# Patient Record
Sex: Male | Born: 1965 | Race: Black or African American | Hispanic: No | Marital: Married | Smoking: Current every day smoker
Health system: Southern US, Community
[De-identification: ages and names within clinical notes are randomized; demographics above are authoritative.]

## PROBLEM LIST (undated history)

## (undated) DIAGNOSIS — I10 Essential (primary) hypertension: Secondary | ICD-10-CM

## (undated) HISTORY — PX: HERNIA REPAIR: SHX51

---

## 2014-08-10 ENCOUNTER — Encounter (HOSPITAL_BASED_OUTPATIENT_CLINIC_OR_DEPARTMENT_OTHER): Payer: Self-pay | Admitting: Emergency Medicine

## 2014-08-10 ENCOUNTER — Emergency Department (HOSPITAL_BASED_OUTPATIENT_CLINIC_OR_DEPARTMENT_OTHER)
Admission: EM | Admit: 2014-08-10 | Discharge: 2014-08-10 | Disposition: A | Discharge status: Home / Self Care | Payer: BLUE CROSS/BLUE SHIELD | Attending: Emergency Medicine | Admitting: Emergency Medicine

## 2014-08-10 ENCOUNTER — Emergency Department (HOSPITAL_BASED_OUTPATIENT_CLINIC_OR_DEPARTMENT_OTHER): Payer: BLUE CROSS/BLUE SHIELD

## 2014-08-10 DIAGNOSIS — R091 Pleurisy: Secondary | ICD-10-CM | POA: Diagnosis not present

## 2014-08-10 DIAGNOSIS — R05 Cough: Secondary | ICD-10-CM | POA: Insufficient documentation

## 2014-08-10 DIAGNOSIS — R739 Hyperglycemia, unspecified: Secondary | ICD-10-CM | POA: Diagnosis not present

## 2014-08-10 DIAGNOSIS — E669 Obesity, unspecified: Secondary | ICD-10-CM | POA: Diagnosis not present

## 2014-08-10 DIAGNOSIS — R0602 Shortness of breath: Secondary | ICD-10-CM | POA: Diagnosis not present

## 2014-08-10 DIAGNOSIS — R079 Chest pain, unspecified: Secondary | ICD-10-CM

## 2014-08-10 DIAGNOSIS — Z720 Tobacco use: Secondary | ICD-10-CM | POA: Diagnosis not present

## 2014-08-10 LAB — CBC WITH DIFFERENTIAL/PLATELET
BASOS ABS: 0 10*3/uL (ref 0.0–0.1)
Basophils Relative: 0 % (ref 0–1)
Eosinophils Absolute: 0.2 10*3/uL (ref 0.0–0.7)
Eosinophils Relative: 1 % (ref 0–5)
HCT: 49.9 % (ref 39.0–52.0)
Hemoglobin: 17 g/dL (ref 13.0–17.0)
LYMPHS PCT: 37 % (ref 12–46)
Lymphs Abs: 4.5 10*3/uL — ABNORMAL HIGH (ref 0.7–4.0)
MCH: 27.8 pg (ref 26.0–34.0)
MCHC: 34.1 g/dL (ref 30.0–36.0)
MCV: 81.5 fL (ref 78.0–100.0)
Monocytes Absolute: 1 10*3/uL (ref 0.1–1.0)
Monocytes Relative: 8 % (ref 3–12)
NEUTROS PCT: 54 % (ref 43–77)
Neutro Abs: 6.5 10*3/uL (ref 1.7–7.7)
Platelets: 203 10*3/uL (ref 150–400)
RBC: 6.12 MIL/uL — AB (ref 4.22–5.81)
RDW: 13.8 % (ref 11.5–15.5)
WBC: 12.2 10*3/uL — ABNORMAL HIGH (ref 4.0–10.5)

## 2014-08-10 LAB — BASIC METABOLIC PANEL
ANION GAP: 6 (ref 5–15)
BUN: 12 mg/dL (ref 6–23)
CHLORIDE: 101 mmol/L (ref 96–112)
CO2: 22 mmol/L (ref 19–32)
Calcium: 8.9 mg/dL (ref 8.4–10.5)
Creatinine, Ser: 0.95 mg/dL (ref 0.50–1.35)
GFR calc Af Amer: >90 mL/min (ref >90)
GFR calc non Af Amer: >90 mL/min (ref >90)
Glucose, Bld: 405 mg/dL — ABNORMAL HIGH (ref 70–99)
Potassium: 4 mmol/L (ref 3.5–5.1)
Sodium: 129 mmol/L — ABNORMAL LOW (ref 135–145)

## 2014-08-10 LAB — TROPONIN I: Troponin I: <0.03 ng/mL (ref <0.031)

## 2014-08-10 LAB — D-DIMER, QUANTITATIVE (NOT AT ARMC): D-Dimer, Quant: <0.27 ug/mL-FEU (ref 0.00–0.48)

## 2014-08-10 MED ORDER — HYDROCODONE-ACETAMINOPHEN 5-325 MG PO TABS: 1.0000 | ORAL_TABLET | Freq: Four times a day (QID) | ORAL | Status: DC | PRN

## 2014-08-10 MED ORDER — METFORMIN HCL 500 MG PO TABS: 500.0000 mg | ORAL_TABLET | Freq: Two times a day (BID) | ORAL | Status: AC

## 2014-08-10 MED ORDER — AZITHROMYCIN 250 MG PO TABS: 250.0000 mg | ORAL_TABLET | Freq: Every day | ORAL | Status: AC

## 2014-08-10 NOTE — ED Notes (Signed)
Pt having chest pain radiating to left ribs since Thursday.  Pt seen at [facility] and was sent home with cough syrup.  Pt did have some cold symptoms but chest discomfort has continued.  Some sob, some diaphoresis.

## 2014-08-10 NOTE — ED Notes (Signed)
Pt d/c home- directed to pharmacy to pick up medications- follow up care discussed

## 2014-08-10 NOTE — ED Provider Notes (Addendum)
CSN: [id number]     Arrival date & time 08/10/14  1058 History   First MD Initiated Contact with Patient 08/10/14 1114     Chief Complaint  Patient presents with  . Chest Pain     (Consider location/radiation/quality/duration/timing/severity/associated sxs/prior Treatment) HPI Comments: Patient was initially taken to [facility] when the pain developed at work on Thursday. He said they did an EKG and a chest x-ray there and told him everything was fine but his blood sugar was elevated. Patient has since made an appointment with a provider for follow-up on his blood sugar  Patient is a 49 y.o. male presenting with chest pain. The history is provided by the patient.  Chest Pain Pain location:  L chest Pain quality: aching and sharp   Pain radiates to:  Does not radiate Pain radiates to the back: no   Pain severity:  Moderate Onset quality:  Sudden Duration:  5 days Timing:  Constant Progression:  Worsening Chronicity:  New Context comment:  Started 5 days ago after he got out of bed.   Relieved by:  Nothing Worsened by:  Coughing, deep breathing and movement Ineffective treatments: ibuprofen. Associated symptoms: cough and shortness of breath   Associated symptoms: no abdominal pain, no altered mental status, no anorexia, no fever, no nausea, not vomiting and no weakness   Cough:    Cough characteristics:  Productive   Sputum characteristics:  Yellow   Severity:  Moderate   Duration:  3 weeks   Timing:  Constant   Progression:  Unchanged   Chronicity:  New Risk factors: male sex, obesity and smoking   Risk factors: no hypertension   Risk factors comment:  Recently had a trip to [place] and back (8 hours both ways)   No past medical history on file. Past Surgical History  Procedure Laterality Date  . Hernia repair     No family history on file. History  Substance Use Topics  . Smoking status: Current Every Day Smoker  . Smokeless tobacco: Not on file  . Alcohol  Use: Not on file    Review of Systems  Constitutional: Negative for fever.  Respiratory: Positive for cough and shortness of breath.   Cardiovascular: Positive for chest pain.  Gastrointestinal: Negative for nausea, vomiting, abdominal pain and anorexia.  Neurological: Negative for weakness.  All other systems reviewed and are negative.     Allergies  Review of patient's allergies indicates no known allergies.  Home Medications   Prior to Admission medications   Not on File   BP 145/96 mmHg  Pulse 101  Temp(Src) 98.2 F (36.8 C) (Oral)  Resp 22  Ht  (1.854 m)  Wt 315 lb (142.883 kg)  BMI 41.57 kg/m2  SpO2 98% Physical Exam  Constitutional: He is oriented to person, place, and time. He appears well-developed and well-nourished. No distress.  HENT:  Head: Normocephalic and atraumatic.  Mouth/Throat: Oropharynx is clear and moist.  Eyes: Conjunctivae and EOM are normal. Pupils are equal, round, and reactive to light.  Neck: Normal range of motion. Neck supple.  Cardiovascular: Normal rate, regular rhythm and intact distal pulses.   No murmur heard. Pulmonary/Chest: Effort normal and breath sounds normal. No respiratory distress. He has no wheezes. He has no rales. He exhibits tenderness.    Abdominal: Soft. He exhibits no distension. There is no tenderness. There is no rebound and no guarding.  Musculoskeletal: Normal range of motion. He exhibits no edema or tenderness.  Neurological: He  is alert and oriented to person, place, and time.  Skin: Skin is warm and dry. No rash noted. No erythema.  Psychiatric: He has a normal mood and affect. His behavior is normal.  Nursing note and vitals reviewed.   ED Course  Procedures (including critical care time) Labs Review Labs Reviewed  CBC WITH DIFFERENTIAL/PLATELET - Abnormal; Notable for the following:    WBC 12.2 (*)    RBC 6.12 (*)    Lymphs Abs 4.5 (*)    All other components within normal limits  BASIC  METABOLIC PANEL - Abnormal; Notable for the following:    Sodium 129 (*)    Glucose, Bld 405 (*)    All other components within normal limits  TROPONIN I  D-DIMER, QUANTITATIVE    Imaging Review Dg Chest 2 View  08/10/2014   CLINICAL DATA:  Right side chest pain and cough since 08/05/2014.  EXAM: CHEST  2 VIEW  COMPARISON:  PA and lateral chest 08/06/2014 and 08/25/2008.  FINDINGS: Heart size and mediastinal contours are within normal limits. Both lungs are clear. Visualized skeletal structures are unremarkable.  IMPRESSION: Negative exam.   Electronically Signed   By: Drusilla Kannerhomas  Dalessio M.D.   On: 08/10/2014 11:29     EKG Interpretation   Date/Time:  Monday August 10 2014 11:04:42 EST Ventricular Rate:  102 PR Interval:  138 QRS Duration: 78 QT Interval:  350 QTC Calculation: 456 R Axis:   74 Text Interpretation:  Sinus tachycardia Nonspecific ST abnormality No  previous tracing Confirmed by Anitra LauthPLUNKETT  MD, Alphonzo LemmingsWHITNEY ([id number]) on 08/10/2014  11:14:24 AM      MDM   Final diagnoses:  Chest pain  Pleurisy  Hyperglycemia    Patient presenting now with 5 days of pleuritic type chest pain in the left side of chest that is not improving. Initially when pain started he was seen at another emergency room and at that time had x-rays and blood work done as well as an EKG he was told everything was normal and he was given cough medicine. However the pain has persisted and he continues to have some shortness of breath.  Of note patient also recently traveled to [place] and back within 4 days prior to the pain starting to 16 hours in the car. He denies unilateral leg pain or swelling and no prior history of clot. He was tachycardic upon arrival here at 102 and pain is reproducible with palpation. He denies any symptoms exacerbated by eating and low suspicion for abdominal pathology at this time. EKG showed sinus tachycardia without other significant findings. Low suspicion for ACS at this time  however concern for PE versus pleurisy as patient has had a lingering URI for the last 3 weeks and is a smoker.  Chest x-ray is within normal limits. CBC, BMP, d-dimer, troponin pending  12:23 PM Pt's CBC with leukocytosis of 12,000 and BMP with hyperglycemia of 400.  Trop and dimer neg.  Concern for possible developing infection not seen on CXR given sx of 3 week and smoker/diabetic and will treat with azithro.  Also will start pt on metformin.     Gwyneth SproutWhitney Daryle Amis, MD 08/10/14 1225  Gwyneth SproutWhitney Dwyne Hasegawa, MD 08/10/14 1228

## 2015-06-25 ENCOUNTER — Emergency Department (HOSPITAL_BASED_OUTPATIENT_CLINIC_OR_DEPARTMENT_OTHER)
Admission: EM | Admit: 2015-06-25 | Discharge: 2015-06-25 | Disposition: A | Discharge status: Home / Self Care | Payer: BLUE CROSS/BLUE SHIELD | Attending: Emergency Medicine | Admitting: Emergency Medicine

## 2015-06-25 ENCOUNTER — Encounter (HOSPITAL_BASED_OUTPATIENT_CLINIC_OR_DEPARTMENT_OTHER): Payer: Self-pay

## 2015-06-25 DIAGNOSIS — T23291A Burn of second degree of multiple sites of right wrist and hand, initial encounter: Secondary | ICD-10-CM | POA: Diagnosis not present

## 2015-06-25 DIAGNOSIS — F17210 Nicotine dependence, cigarettes, uncomplicated: Secondary | ICD-10-CM | POA: Diagnosis not present

## 2015-06-25 DIAGNOSIS — T23001A Burn of unspecified degree of right hand, unspecified site, initial encounter: Secondary | ICD-10-CM | POA: Diagnosis present

## 2015-06-25 DIAGNOSIS — Y92190 Kitchen in other specified residential institution as the place of occurrence of the external cause: Secondary | ICD-10-CM | POA: Diagnosis not present

## 2015-06-25 DIAGNOSIS — Z23 Encounter for immunization: Secondary | ICD-10-CM | POA: Diagnosis not present

## 2015-06-25 DIAGNOSIS — Y93G3 Activity, cooking and baking: Secondary | ICD-10-CM | POA: Diagnosis not present

## 2015-06-25 DIAGNOSIS — T23231A Burn of second degree of multiple right fingers (nail), not including thumb, initial encounter: Secondary | ICD-10-CM | POA: Insufficient documentation

## 2015-06-25 DIAGNOSIS — Y998 Other external cause status: Secondary | ICD-10-CM | POA: Diagnosis not present

## 2015-06-25 DIAGNOSIS — X150XXA Contact with hot stove (kitchen), initial encounter: Secondary | ICD-10-CM | POA: Diagnosis not present

## 2015-06-25 DIAGNOSIS — I10 Essential (primary) hypertension: Secondary | ICD-10-CM | POA: Insufficient documentation

## 2015-06-25 DIAGNOSIS — T23251A Burn of second degree of right palm, initial encounter: Secondary | ICD-10-CM | POA: Diagnosis not present

## 2015-06-25 DIAGNOSIS — T23201A Burn of second degree of right hand, unspecified site, initial encounter: Secondary | ICD-10-CM

## 2015-06-25 DIAGNOSIS — Z7984 Long term (current) use of oral hypoglycemic drugs: Secondary | ICD-10-CM | POA: Diagnosis not present

## 2015-06-25 HISTORY — DX: Essential (primary) hypertension: I10

## 2015-06-25 MED ORDER — SILVER SULFADIAZINE 1 % EX CREA
TOPICAL_CREAM | Freq: Once | CUTANEOUS | Status: AC
  Administered 2015-06-25: 07:00:00 via TOPICAL
  Filled 2015-06-25: qty 85

## 2015-06-25 MED ORDER — HYDROCODONE-ACETAMINOPHEN 5-325 MG PO TABS: 1.0000 | ORAL_TABLET | Freq: Four times a day (QID) | ORAL | Status: DC | PRN

## 2015-06-25 MED ORDER — SILVER SULFADIAZINE 1 % EX CREA: 1.0000 "application " | TOPICAL_CREAM | Freq: Every day | CUTANEOUS | Status: AC

## 2015-06-25 MED ORDER — TETANUS-DIPHTH-ACELL PERTUSSIS 5-2.5-18.5 LF-MCG/0.5 IM SUSP
0.5000 mL | Freq: Once | INTRAMUSCULAR | Status: AC
  Administered 2015-06-25: 0.5 mL via INTRAMUSCULAR
  Filled 2015-06-25: qty 0.5
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
## 2015-06-25 NOTE — Discharge Instructions (Signed)
Burn Care °Your skin is a natural barrier to infection. It is the largest organ of your body. Burns damage this natural protection. To help prevent infection, it is very important to follow your caregiver's instructions in the care of your burn. °Burns are classified as: °· First degree. There is only redness of the skin (erythema). No scarring is expected. °· Second degree. There is blistering of the skin. Scarring may occur with deeper burns. °· Third degree. All layers of the skin are injured, and scarring is expected. °HOME CARE INSTRUCTIONS  °· Wash your hands well before changing your bandage. °· Change your bandage as often as directed by your caregiver. °· Remove the old bandage. If the bandage sticks, you may soak it off with cool, clean water. °· Cleanse the burn thoroughly but gently with mild soap and water. °· Pat the area dry with a clean, dry cloth. °· Apply a thin layer of antibacterial cream to the burn. °· Apply a clean bandage as instructed by your caregiver. °· Keep the bandage as clean and dry as possible. °· Elevate the affected area for the first 24 hours, then as instructed by your caregiver. °· Only take over-the-counter or prescription medicines for pain, discomfort, or fever as directed by your caregiver. °SEEK IMMEDIATE MEDICAL CARE IF:  °· You develop excessive pain. °· You develop redness, tenderness, swelling, or red streaks near the burn. °· The burned area develops yellowish-white fluid (pus) or a bad smell. °· You have a fever. °MAKE SURE YOU:  °· Understand these instructions. °· Will watch your condition. °· Will get help right away if you are not doing well or get worse. °  °This information is not intended to replace advice given to you by your health care provider. Make sure you discuss any questions you have with your health care provider. °  °Document Released: 07/03/2005 Document Revised: 09/25/2011 Document Reviewed: 11/23/2010 °Elsevier Interactive Patient Education ©2016  [employer] ° °Second-Degree Burn °A second-degree burn affects the 2 outer layers of skin. The outer layer (epidermis) and the layer underneath it (dermis) are both burned. Another name for this type of burn is a partial thickness burn. A second-degree burn may be called minor or major. This depends on the size of the burn. It also depends on what parts of the skin are burned. Minor burns may be treated with first aid. Major burns are a medical emergency. °A second-degree burn is worse than a first-degree burn, but not as bad as a third-degree burn. A first-degree burn affects only the epidermis. A third-degree burn goes through all the layers of skin. A second-degree burn usually heals in 3 to 4 weeks. A minor second-degree burn usually does not leave a scar. Deeper second-degree burns may lead to scarring of the skin or contractures over joints. Contractures are scars that form over joints and may lead to reduced mobility at those joints. °CAUSES °· Heat (thermal) injury. This happens when skin comes in contact with something very hot. It could be a flame, a hot object, hot liquid, or steam. Most second-degree burns are thermal injuries. °· Radiation. Sunlight is one type of radiation that can burn the skin. Another type of radiation is used to heat food. Radiation is also used to treat some diseases, such as cancer. All types of radiation can burn the skin. Sunlight usually causes a first-degree burn. Radiation used for heating food or treating a disease can cause a second-degree burn. °· Electricity. Electrical burns can cause more   damage under the skin than on the surface. They should always be treated as major burns. °· Chemicals. Many chemicals can burn the skin. The burn should be flushed with cool water and checked by an emergency caregiver. °SYMPTOMS °Symptoms of second-degree burns include: °· Severe pain. °· Extreme tenderness. °· Deep redness. °· Blistered skin. °· Skin that has changed color. It  might look blotchy, wet, or shiny. °· Swelling. °TREATMENT °Some second-degree burns may need to be treated in a hospital. These include major burns, electrical burns, and chemical burns. Many other second-degree burns can be treated with regular first aid, such as: °· Cooling the burn. Use cool, germ-free (sterile) salt water. Place the burned area of skin into a tub of water, or cover the burned area with clean, wet towels. °· Taking pain medicine. °· Removing the dead skin from broken blisters. A trained caregiver may do this. Do not pop blisters. °· Gently washing your skin with mild soap. °· Covering the burned area with a cream. Silver sulfadiazine is a cream for burns. An antibiotic cream, such as bacitracin, may also be used to fight infection. Do not use other ointments or creams unless your caregiver says it is okay. °· Protecting the burn with a sterile, non-sticky bandage. °· Bandaging fingers and toes separately. This keeps them from sticking together. °· Taking an antibiotic. This can help prevent infection. °· Getting a tetanus shot. °HOME CARE INSTRUCTIONS °Medication °· Take any medicine prescribed by your caregiver. Follow the directions carefully. °· Ask your caregiver if you can take over-the-counter medicine to relieve pain and swelling. Do not give aspirin to children. °· Make sure your caregiver knows about all other medicines you take. This includes over-the-counter medicines. °Burn care °· You will need to change the bandage on your burn. You may need to do this 2 or 3 times each day. °¨ Gently clean the burned area. °¨ Put ointment on it. °¨ Cover the burn with a sterile bandage. °· For some deeper burns or burns that cover a large area, compression garments may be prescribed. These garments can help minimize scarring and protect your mobility. °· Do not put butter or oil on your skin. Use only the cream prescribed by your caregiver. °· Do not put ice on your burn. °· Do not break blisters  on your skin. °· Keep the bandaged area dry. You might need to take a sponge bath for awhile. Ask your caregiver when you can take a shower or a tub bath again. °· Do not scratch an itchy burn. Your caregiver may give you medicine to relieve very bad itching. °· Infection is a big danger after a second-degree burn. Tell your caregiver right away if you have signs of infection, such as: °¨ Redness or changing color in the burned area. °¨ Fluid leaking from the burn. °¨ Swelling in the burn area. °¨ A bad smell coming from the wound. °Follow-up °· Keep all follow-up appointments. This is important. This is how your caregiver can tell if your treatment is working. °· Protect your burn from sunlight. Use sunscreen whenever you go outside. Burned areas may be sensitive to the sun for up to 1 year. Exposure to the sun may also cause permanent darkening of scars. °SEEK MEDICAL CARE IF: °· You have any questions about medicines. °· You have any questions about your treatment. °· You wonder if it is okay to do a particular activity. °· You develop a fever of more than 100.5° F (38.1° C). °SEEK IMMEDIATE MEDICAL CARE IF: °·   You think your burn might be infected. It may change color, become red, leak fluid, swell, or smell bad. °· You develop a fever of more than 102° F (38.9° C). °  °This information is not intended to replace advice given to you by your health care provider. Make sure you discuss any questions you have with your health care provider. °  °Document Released: 12/05/2010 Document Revised: 09/25/2011 Document Reviewed: 12/05/2010 °Elsevier Interactive Patient Education ©2016 [employer] ° °
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
## 2015-06-25 NOTE — ED Notes (Signed)
MD at bedside. 

## 2018-05-30 ENCOUNTER — Encounter (HOSPITAL_BASED_OUTPATIENT_CLINIC_OR_DEPARTMENT_OTHER): Payer: Self-pay | Admitting: *Deleted

## 2018-05-30 ENCOUNTER — Other Ambulatory Visit: Payer: Self-pay

## 2018-05-30 ENCOUNTER — Emergency Department (HOSPITAL_COMMUNITY): Payer: BLUE CROSS/BLUE SHIELD

## 2018-05-30 ENCOUNTER — Emergency Department (HOSPITAL_BASED_OUTPATIENT_CLINIC_OR_DEPARTMENT_OTHER)
Admission: EM | Admit: 2018-05-30 | Discharge: 2018-05-30 | Disposition: A | Discharge status: Home / Self Care | Payer: BLUE CROSS/BLUE SHIELD | Attending: Emergency Medicine | Admitting: Emergency Medicine

## 2018-05-30 DIAGNOSIS — F17210 Nicotine dependence, cigarettes, uncomplicated: Secondary | ICD-10-CM | POA: Insufficient documentation

## 2018-05-30 DIAGNOSIS — M5441 Lumbago with sciatica, right side: Secondary | ICD-10-CM | POA: Diagnosis not present

## 2018-05-30 DIAGNOSIS — Z7984 Long term (current) use of oral hypoglycemic drugs: Secondary | ICD-10-CM | POA: Diagnosis not present

## 2018-05-30 DIAGNOSIS — I10 Essential (primary) hypertension: Secondary | ICD-10-CM | POA: Insufficient documentation

## 2018-05-30 DIAGNOSIS — R531 Weakness: Secondary | ICD-10-CM | POA: Insufficient documentation

## 2018-05-30 DIAGNOSIS — R29898 Other symptoms and signs involving the musculoskeletal system: Secondary | ICD-10-CM

## 2018-05-30 DIAGNOSIS — M545 Low back pain: Secondary | ICD-10-CM | POA: Diagnosis present

## 2018-05-30 MED ORDER — HYDROCODONE-ACETAMINOPHEN 5-325 MG PO TABS
2.0000 | ORAL_TABLET | Freq: Once | ORAL | Status: AC
  Administered 2018-05-30: 2 via ORAL
  Filled 2018-05-30: qty 2

## 2018-05-30 MED ORDER — HYDROCODONE-ACETAMINOPHEN 5-325 MG PO TABS: 1.0000 | ORAL_TABLET | Freq: Four times a day (QID) | ORAL | 0 refills | Status: DC | PRN

## 2018-05-30 MED ORDER — KETOROLAC TROMETHAMINE 60 MG/2ML IM SOLN
60.0000 mg | Freq: Once | INTRAMUSCULAR | Status: AC
  Administered 2018-05-30: 60 mg via INTRAMUSCULAR
  Filled 2018-05-30: qty 2

## 2018-05-30 MED ORDER — DIAZEPAM 5 MG/ML IJ SOLN
5.0000 mg | Freq: Once | INTRAMUSCULAR | Status: AC
  Administered 2018-05-30: 5 mg via INTRAMUSCULAR
  Filled 2018-05-30: qty 2

## 2018-05-30 NOTE — ED Notes (Signed)
Pt. Return from MRI via stretcher. 

## 2018-05-30 NOTE — ED Provider Notes (Signed)
[facility] EMERGENCY DEPARTMENT Provider Note   CSN: [id number] Arrival date & time: 05/30/18  1721     History   Chief Complaint Chief Complaint  Patient presents with  . Back Pain    HPI Corey Dominguez is a 52 y.o. male possible history of hypertension, lumbar radiculopathy who presents for evaluation of persistent right lower back pain that extends into the right lower extremity that is been ongoing for last week.  Patient reports he has a history of lower back pain and is seen by orthopedics.  He reports that he will intermittently get injections for his pain control.  He reports about a week ago, he was lifting approximately 100 pounds at work and states that afterwards, he had right lower back pain with sharp shooting pain that extended into his right lower extremity.  Patient was seen by his orthopedic PA a few days ago, he prescribed Tylenol, ibuprofen as well as a short course of prednisone.  Patient reports he has been taking those medications with no improvement.  He states that beginning yesterday, he started developing some numbness in the right lower extremity which he states is new.  He states that the numbness extends from the anterior aspect just below the knee down to his foot.  He states that it is intermittent and comes and goes.  He reports he has been able to walk but does report that when he walks, causes worsening pain of his right lower extremity and he feels like it "gets more fatigued and he has to sit down because it hurts and he is afraid is going to give out."  Patient does report that walking is worsening because it makes his pain worse.  Denies any new preceding trauma, injury, fall.  Patient called orthopedics today and was prompted to go to the ED.  He was told by the orthopedics that he needed to go get an MRI.  Patient denies any fevers, urinary or bowel incontinence, saddle anesthesia.  He does not have any history of back surgeries.  The history is  provided by the patient.    Past Medical History:  Diagnosis Date  . Hypertension     There are no active problems to display for this patient.   Past Surgical History:  Procedure Laterality Date  . HERNIA REPAIR          Home Medications    Prior to Admission medications   Medication Sig Start Date End Date Taking? Authorizing Provider  atorvastatin (LIPITOR) 20 MG tablet Take 20 mg by mouth daily.   Yes [provider]  GABAPENTIN PO Take by mouth.   Yes [provider]  lisinopril (PRINIVIL,ZESTRIL) 20 MG tablet Take 20 mg by mouth daily.   Yes [provider]  metFORMIN (GLUCOPHAGE) 500 MG tablet Take 1 tablet (500 mg total) by mouth 2 (two) times daily with a meal. 08/10/14  Yes Plunkett, Alphonzo Lemmings, MD  azithromycin (ZITHROMAX) 250 MG tablet Take 1 tablet (250 mg total) by mouth daily. Take first 2 tablets together, then 1 every day until finished. 08/10/14   Gwyneth Sprout, MD  HYDROcodone-acetaminophen (NORCO/VICODIN) 5-325 MG tablet Take 1 tablet by mouth every 6 (six) hours as needed. 05/30/18   Gerhard Munch, MD  silver sulfADIAZINE (SILVADENE) 1 % cream Apply 1 application topically daily. 06/25/15   Tilden Fossa, MD    Family History No family history on file.  Social History Social History   Tobacco Use  . Smoking status:  Current Every Day Smoker    Packs/day: 0.50    Types: Cigarettes  . Smokeless tobacco: Never Used  Substance Use Topics  . Alcohol use: No  . Drug use: No     Allergies   Patient has no known allergies.   Review of Systems Review of Systems  Constitutional: Negative for fever.  Musculoskeletal: Positive for back pain. Negative for neck pain.  Neurological: Positive for weakness and numbness. Negative for headaches.  All other systems reviewed and are negative.    Physical Exam Updated Vital Signs BP 131/89 (BP Location: Right Arm)   Pulse 85   Temp 98.6 F (37 C) (Oral)   Resp 16   Ht 6'  1" (1.854 m)   Wt 124.7 kg   SpO2 98%   BMI 36.28 kg/m   Physical Exam  Constitutional: He appears well-developed and well-nourished.  HENT:  Head: Normocephalic and atraumatic.  Eyes: Conjunctivae and EOM are normal. Right eye exhibits no discharge. Left eye exhibits no discharge. No scleral icterus.  Neck: Full passive range of motion without pain.  Full flexion/extension and lateral movement of neck fully intact. No bony midline tenderness. No deformities or crepitus.   Cardiovascular:  Pulses:      Dorsalis pedis pulses are 2+ on the right side, and 2+ on the left side.  Pulmonary/Chest: Effort normal.  Musculoskeletal:       Thoracic back: He exhibits no tenderness.       Back:  No midline C or T-spine tenderness.  Diffuse lumbar tenderness that begins at the midline and extends over to the right paraspinal muscles of the lumbar region.  Neurological: He is alert.  Reflex Scores:      Patellar reflexes are 1+ on the right side and 2+ on the left side. Positive straight leg raise test on right lower extremity.  Negative straight leg raise on left lower extremity. 5/5 strength LLE, Slightly decreased 4/5 strength to RLE. He is only able to lift it about and inch off the bed.  Dorsiflexion and plantarflexion intact bilaterally without any difficulty.  Sensation intact along major nerve distributions of BLE. Currently denies any sensation changes.   Skin: Skin is warm and dry.  Psychiatric: He has a normal mood and affect. His speech is normal and behavior is normal.  Nursing note and vitals reviewed.    ED Treatments / Results  Labs (all labs ordered are listed, but only abnormal results are displayed) Labs Reviewed - No data to display  EKG None  Radiology Mr Lumbar Spine Wo Contrast  Result Date: 05/30/2018 CLINICAL DATA:  Initial evaluation for acute sharp right-sided lower back pain extending into the right lower extremity. Recent injury. EXAM: MRI LUMBAR SPINE  WITHOUT CONTRAST TECHNIQUE: Multiplanar, multisequence MR imaging of the lumbar spine was performed. No intravenous contrast was administered. COMPARISON:  Prior MRI from 04/16/2018. FINDINGS: Segmentation: Normal segmentation. Lowest well-formed disc labeled the L5-S1 level. Alignment: 5 mm anterolisthesis of L4 on L5. 3 mm retrolisthesis of L3 on L4. Alignment otherwise normal with preservation of the normal lumbar lordosis. Vertebrae: Vertebral body heights maintained without evidence for acute or chronic fracture. Bone marrow signal intensity normal. No discrete or worrisome osseous lesions. No abnormal marrow edema. Conus medullaris and cauda equina: Conus extends to the L1 level. Conus and cauda equina appear normal. Paraspinal and other soft tissues: Paraspinous soft tissues within normal limits. Approximate 1 cm T2 hyperintense right renal cyst noted. Visualized visceral structures otherwise unremarkable. Disc levels:  T11-12: Disc desiccation with minimal disc bulge.  No stenosis. T12-L1: Disc desiccation with minimal disc bulge.  No stenosis. L1-2: Mild facet hypertrophy.  No stenosis or impingement. L2-3: Mild diffuse disc bulge with disc desiccation. Shallow left extraforaminal disc protrusion contacts the exiting left L2 nerve root as it courses of the left neural foramen (series 3, image 11). Appearance is stable from previous. Mild facet hypertrophy. No significant spinal stenosis. Foramina remain patent. L3-4: Diffuse disc bulge with disc desiccation and intervertebral disc space narrowing. Superimposed tiny central disc extrusion with inferior migration, unchanged. Left extraforaminal annular fissure, also unchanged. Moderate facet hypertrophy. Resultant mild to moderate canal with bilateral subarticular stenosis, unchanged. Mild bilateral L3 foraminal narrowing also stable. L4-5: Anterolisthesis. Diffuse disc bulge with disc desiccation and mild intervertebral disc space narrowing. Severe bilateral  facet arthrosis. Associated trace bilateral joint effusions. Resultant moderate canal with severe bilateral subarticular stenosis. Mild to moderate bilateral L4 foraminal narrowing, right worse than left. Overall, appearance is stable from previous. L5-S1: Diffuse disc bulge with disc desiccation and intervertebral disc space narrowing. Mild reactive endplate changes with marginal endplate osteophytic spurring. Left greater than right facet hypertrophy. Mild bilateral lateral recess narrowing without significant spinal stenosis. Mild bilateral L5 foraminal stenosis, unchanged. IMPRESSION: 1. No acute abnormality within the lumbar spine. Overall, appearance is relatively stable as compared to recent MRI from 04/16/2018. 2. 5 mm anterolisthesis of L4 on L5 with associated severe facet arthropathy, resulting in moderate canal with severe bilateral subarticular stenosis, with moderate to severe right greater than left L4 foraminal narrowing. 3. Shallow left extraforaminal disc protrusion at L2-3, closely approximating and potentially irritating the exiting left L2 nerve root. 4. Disc bulging with small central disc extrusion at L3-4 with resultant mild to moderate canal and bilateral subarticular stenosis. Electronically Signed   By: Rise Mu M.D.   On: 05/30/2018 22:30    Procedures Procedures (including critical care time)  Medications Ordered in ED Medications  diazepam (VALIUM) injection 5 mg (5 mg Intramuscular Given 05/30/18 1816)  HYDROcodone-acetaminophen (NORCO/VICODIN) 5-325 MG per tablet 2 tablet (2 tablets Oral Given 05/30/18 1814)  ketorolac (TORADOL) injection 60 mg (60 mg Intramuscular Given 05/30/18 1818)     Initial Impression / Assessment and Plan / ED Course  I have reviewed the triage vital signs and the nursing notes.  Pertinent labs & imaging results that were available during my care of the patient were reviewed by me and considered in my medical decision making (see  chart for details).     52 year old male with past month history of degenerative disc disease who presents for evaluation of worsening right lower back pain that extends to the right lower extremity.  Reports that he lifted a heavy object about a week ago and has had some radiculopathy pain since then.  Was seen by orthopedics 4 days ago and was prescribed prednisone.  States he is continued to have pain.  States that yesterday, he has had intermittent numbness, weakness to the right lower extremity.  States that at certain times, he will have numbness that extends from the inferior portion of the knee that extends distally down to the foot.  Additionally, he feels like he having difficulty walking secondary to the right lower extremity.  He states that it will become "very painful and feels like it is going to give out and he cannot lift it up properly."  Question if this is secondary to pain from radiculopathy or true weakness.  He has not  had any urinary or bowel incontinence or saddle anesthesia.  No fevers.  He did have a recent injection for pain about 2 half weeks ago.  No other history of back surgery.  On exam, he does have positive straight leg raise test.  Dorsiflexion and plantar flexion intact.  Patellar reflex slightly diminished on the right side.  Additionally, he has some slight weakness.  Left lower extremity he is able to move without any difficulty.  He has difficulty moving the right lower extremity off the bed and can only raise it about an inch.  Question of this is secondary to pain rather true weakness.  Will try pain control here in the ED and reassess.  At this time, low suspicion for cauda equina.  Consider radiculopathy versus disc herniation.  Reevaluation after analgesics.  He reports slight improvement in his pain is now rating is 7/10.  Repeat exam show no changes in strength.  He is still only able to lift it minimally off the bed whereas the left lower extremity he is able to  lift without any difficulty.  Again question if this is really secondary to true weakness but given differences exam, question if this is worsening disc herniation.  Discussed with Dr. Dalene SeltzerSchlossman who agrees with plan for MRI here in the ED.  Unfortunately at this facility, we do not have MRI.  [facility] further [facility] testing.  Patient wishes to go POV.  Wife is on the way to come take patient.  Discussed with Dr. Jeraldine LootsLockwood [facility]([place]) who accepts patient for transfer.  Patient stable to go by POV.  Final Clinical Impressions(s) / ED Diagnoses   Final diagnoses:  Acute right-sided low back pain with right-sided sciatica  Weakness of right lower extremity    ED Discharge Orders         Ordered    HYDROcodone-acetaminophen (NORCO/VICODIN) 5-325 MG tablet  Every 6 hours PRN     05/30/18 2241           Maxwell CaulLayden, Raygan Skarda A, PA-C 05/31/18 [id number]    Alvira MondaySchlossman, Erin, MD 05/31/18 1103

## 2018-05-30 NOTE — ED Triage Notes (Signed)
WC back injury a week ago. Pain is not getting better.

## 2018-05-30 NOTE — ED Triage Notes (Signed)
Pt here as POV Tx from [facility] for MRI Lumbar Spine

## 2018-05-30 NOTE — ED Provider Notes (Signed)
Patient seen on arrival from transfer from our facility facility for MRI. He was also seen after the study, has no ongoing complaints, notes that his symptoms are worse with activity. MRI results discussed with the patient and his wife. Patient discharged with copy of his results to follow-up with his neurosurgeon.   Gerhard MunchLockwood, Durward Matranga, MD 05/30/18 2238

## 2018-05-30 NOTE — Discharge Instructions (Signed)
Today's study has been generally reassuring, please be sure to schedule a follow-up with your orthopedist as soon as possible. Here are the results from the MRI:   CLINICAL DATA:  Initial evaluation for acute sharp right-sided lower back pain extending into the right lower extremity. Recent injury.  EXAM: MRI LUMBAR SPINE WITHOUT CONTRAST  TECHNIQUE: Multiplanar, multisequence MR imaging of the lumbar spine was performed. No intravenous contrast was administered.  COMPARISON:  Prior MRI from 04/16/2018.  FINDINGS: Segmentation: Normal segmentation. Lowest well-formed disc labeled the L5-S1 level.  Alignment: 5 mm anterolisthesis of L4 on L5. 3 mm retrolisthesis of L3 on L4. Alignment otherwise normal with preservation of the normal lumbar lordosis.  Vertebrae: Vertebral body heights maintained without evidence for acute or chronic fracture. Bone marrow signal intensity normal. No discrete or worrisome osseous lesions. No abnormal marrow edema.  Conus medullaris and cauda equina: Conus extends to the L1 level. Conus and cauda equina appear normal.  Paraspinal and other soft tissues: Paraspinous soft tissues within normal limits. Approximate 1 cm T2 hyperintense right renal cyst noted. Visualized visceral structures otherwise unremarkable.  Disc levels:  T11-12: Disc desiccation with minimal disc bulge.  No stenosis.  T12-L1: Disc desiccation with minimal disc bulge.  No stenosis.  L1-2: Mild facet hypertrophy.  No stenosis or impingement.  L2-3: Mild diffuse disc bulge with disc desiccation. Shallow left extraforaminal disc protrusion contacts the exiting left L2 nerve root as it courses of the left neural foramen (series 3, image 11). Appearance is stable from previous. Mild facet hypertrophy. No significant spinal stenosis. Foramina remain patent.  L3-4: Diffuse disc bulge with disc desiccation and intervertebral disc space narrowing. Superimposed tiny central disc  extrusion with inferior migration, unchanged. Left extraforaminal annular fissure, also unchanged. Moderate facet hypertrophy. Resultant mild to moderate canal with bilateral subarticular stenosis, unchanged. Mild bilateral L3 foraminal narrowing also stable.  L4-5: Anterolisthesis. Diffuse disc bulge with disc desiccation and mild intervertebral disc space narrowing. Severe bilateral facet arthrosis. Associated trace bilateral joint effusions. Resultant moderate canal with severe bilateral subarticular stenosis. Mild to moderate bilateral L4 foraminal narrowing, right worse than left. Overall, appearance is stable from previous.  L5-S1: Diffuse disc bulge with disc desiccation and intervertebral disc space narrowing. Mild reactive endplate changes with marginal endplate osteophytic spurring. Left greater than right facet hypertrophy. Mild bilateral lateral recess narrowing without significant spinal stenosis. Mild bilateral L5 foraminal stenosis, unchanged.  IMPRESSION: 1. No acute abnormality within the lumbar spine. Overall, appearance is relatively stable as compared to recent MRI from 04/16/2018. 2. 5 mm anterolisthesis of L4 on L5 with associated severe facet arthropathy, resulting in moderate canal with severe bilateral subarticular stenosis, with moderate to severe right greater than left L4 foraminal narrowing. 3. Shallow left extraforaminal disc protrusion at L2-3, closely approximating and potentially irritating the exiting left L2 nerve root. 4. Disc bulging with small central disc extrusion at L3-4 with resultant mild to moderate canal and bilateral subarticular stenosis.

## 2019-09-12 ENCOUNTER — Other Ambulatory Visit (HOSPITAL_COMMUNITY): Payer: Self-pay | Admitting: Sports Medicine

## 2019-09-12 DIAGNOSIS — T84031A Mechanical loosening of internal left hip prosthetic joint, initial encounter: Secondary | ICD-10-CM

## 2019-09-15 ENCOUNTER — Other Ambulatory Visit: Payer: Self-pay | Admitting: Sports Medicine

## 2019-09-15 DIAGNOSIS — T84031D Mechanical loosening of internal left hip prosthetic joint, subsequent encounter: Secondary | ICD-10-CM

## 2019-09-15 DIAGNOSIS — M76892 Other specified enthesopathies of left lower limb, excluding foot: Secondary | ICD-10-CM

## 2019-09-23 ENCOUNTER — Other Ambulatory Visit: Payer: Self-pay

## 2019-09-23 ENCOUNTER — Encounter (HOSPITAL_COMMUNITY)
Admission: RE | Admit: 2019-09-23 | Discharge: 2019-09-23 | Disposition: A | Discharge status: Home / Self Care | Payer: No Typology Code available for payment source | Source: Ambulatory Visit | Attending: Sports Medicine | Admitting: Sports Medicine

## 2019-09-23 DIAGNOSIS — M76892 Other specified enthesopathies of left lower limb, excluding foot: Secondary | ICD-10-CM | POA: Insufficient documentation

## 2019-09-23 DIAGNOSIS — T84031D Mechanical loosening of internal left hip prosthetic joint, subsequent encounter: Secondary | ICD-10-CM | POA: Insufficient documentation

## 2019-09-23 MED ORDER — TECHNETIUM TC 99M MEDRONATE IV KIT
20.0000 | PACK | Freq: Once | INTRAVENOUS | Status: AC | PRN
  Administered 2019-09-23: 20 via INTRAVENOUS

## 2019-09-23 MED ORDER — TECHNETIUM TC 99M SULFUR COLLOID FILTERED: 1.0000 | Freq: Once | INTRAVENOUS | Status: DC | PRN

## 2020-11-29 IMAGING — NM NM BONE 3 PHASE
10 series · 20 of 20 positions shown · non-contrast
Comparison: None

CLINICAL DATA: LEFT hip replacement

EXAM:
NUCLEAR MEDICINE 3-PHASE BONE SCAN
TECHNIQUE: Radionuclide angiographic images, immediate static blood pool
images, and 3-hour delayed static images were obtained of the hips
after intravenous injection of radiopharmaceutical.
RADIOPHARMACEUTICALS:  21.7 mCi Oc-33m MDP IV

[Series 1: flow · 2.07mm/px · 6 of 48 frames shown (1 of 2)]
[frame 5/48]
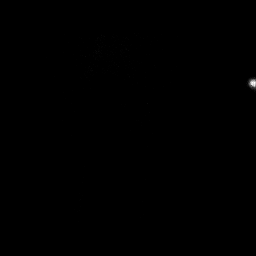
[frame 13/48]
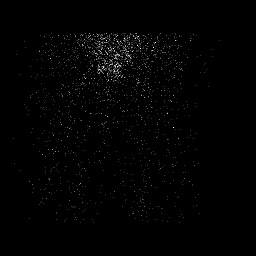
[frame 21/48]
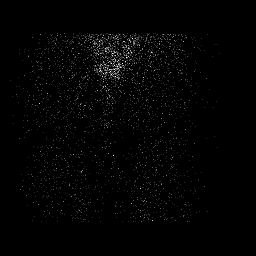
[frame 29/48]
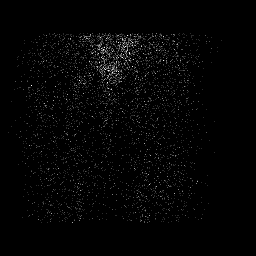
[frame 37/48]
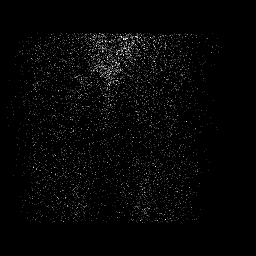
[frame 45/48]
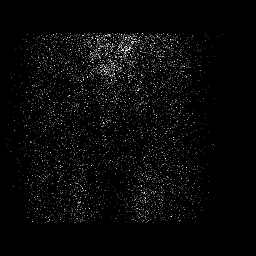

[Series 1: flow · 2.07mm/px · 6 of 48 frames shown (2 of 2)]
[frame 5/48]
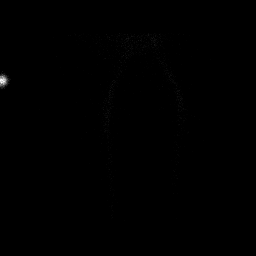
[frame 13/48]
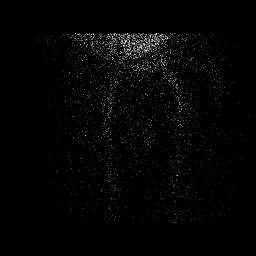
[frame 21/48]
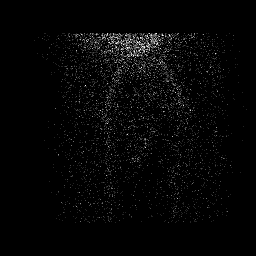
[frame 29/48]
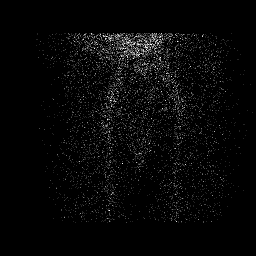
[frame 37/48]
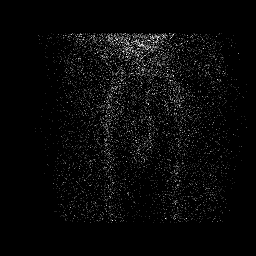
[frame 45/48]
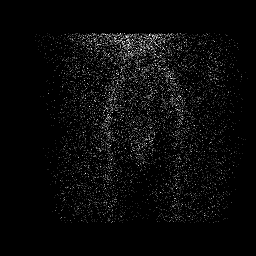

[Series 2: blood pool · 2.07mm/px · 1 of 1 slices shown (1 of 2)]
[im 1/1]
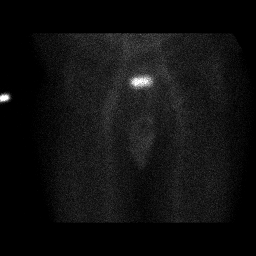

[Series 2: blood pool · 2.07mm/px · 1 of 1 slices shown (2 of 2)]
[im 1/1]
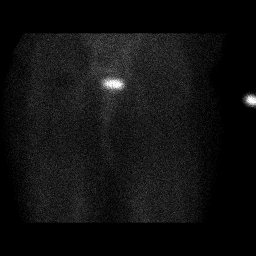

[Series 3: lat bp · 2.07mm/px · 1 of 1 slices shown (1 of 2)]
[im 1/1]
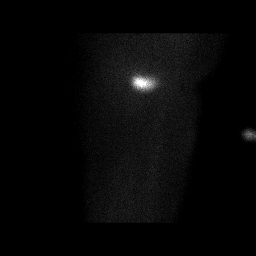

[Series 3: lat bp · 2.07mm/px · 1 of 1 slices shown (2 of 2)]
[im 1/1]
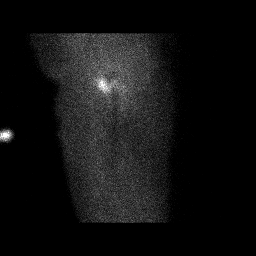

[Series 4: delay · delayed · 2.07mm/px · 1 of 1 slices shown (1 of 4)]
[im 1/1]
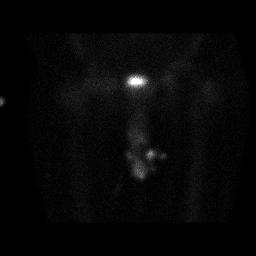

[Series 4: delay · delayed · 2.07mm/px · 1 of 1 slices shown (2 of 4)]
[im 1/1]
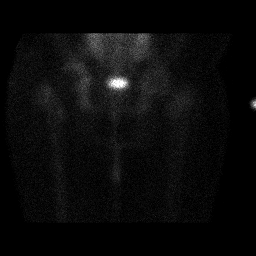

[Series 5: delay · delayed · 2.07mm/px · 1 of 1 slices shown (3 of 4)]
[im 1/1]
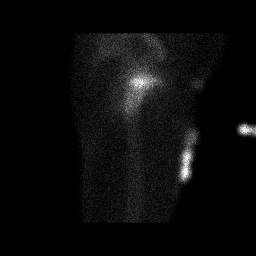

[Series 5: delay · delayed · 2.07mm/px · 1 of 1 slices shown (4 of 4)]
[im 1/1]
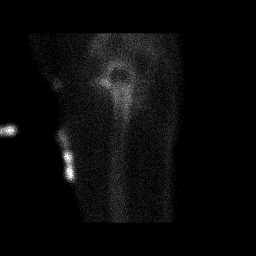

[20 of 20 positions shown; findings below may reference images not displayed]

FINDINGS: Vascular phase: No asymmetric or increased blood flow to the LEFT or
RIGHT hip.

Blood pool phase: No asymmetric or increased blood pool activity in
LEFT or RIGHT hip.

Delayed phase: No asymmetric or increased delayed activity LEFT
RIGHT hip. Photopenia noted at the LEFT prosthetic femoral head.
IMPRESSION: 1. No loosening or infection of the LEFT femoral prosthetic.
2. No significant arthropathy identified

## 2022-01-06 ENCOUNTER — Emergency Department (HOSPITAL_COMMUNITY): Payer: BC Managed Care – PPO

## 2022-01-06 ENCOUNTER — Encounter (HOSPITAL_COMMUNITY): Payer: Self-pay

## 2022-01-06 ENCOUNTER — Emergency Department (HOSPITAL_COMMUNITY)
Admission: EM | Admit: 2022-01-06 | Discharge: 2022-01-07 | Disposition: A | Discharge status: Home / Self Care | Payer: BC Managed Care – PPO | Attending: Emergency Medicine | Admitting: Emergency Medicine

## 2022-01-06 DIAGNOSIS — Z79899 Other long term (current) drug therapy: Secondary | ICD-10-CM | POA: Diagnosis not present

## 2022-01-06 DIAGNOSIS — R000 Tachycardia, unspecified: Secondary | ICD-10-CM | POA: Insufficient documentation

## 2022-01-06 DIAGNOSIS — R251 Tremor, unspecified: Secondary | ICD-10-CM | POA: Insufficient documentation

## 2022-01-06 DIAGNOSIS — Z7984 Long term (current) use of oral hypoglycemic drugs: Secondary | ICD-10-CM | POA: Insufficient documentation

## 2022-01-06 DIAGNOSIS — R7989 Other specified abnormal findings of blood chemistry: Secondary | ICD-10-CM

## 2022-01-06 DIAGNOSIS — R509 Fever, unspecified: Secondary | ICD-10-CM | POA: Diagnosis not present

## 2022-01-06 DIAGNOSIS — R6883 Chills (without fever): Secondary | ICD-10-CM

## 2022-01-06 DIAGNOSIS — Z20822 Contact with and (suspected) exposure to covid-19: Secondary | ICD-10-CM | POA: Diagnosis not present

## 2022-01-06 DIAGNOSIS — R7401 Elevation of levels of liver transaminase levels: Secondary | ICD-10-CM | POA: Diagnosis not present

## 2022-01-06 DIAGNOSIS — Z9889 Other specified postprocedural states: Secondary | ICD-10-CM

## 2022-01-06 DIAGNOSIS — K8020 Calculus of gallbladder without cholecystitis without obstruction: Secondary | ICD-10-CM

## 2022-01-06 LAB — COMPREHENSIVE METABOLIC PANEL
ALT: 30 U/L (ref 0–44)
AST: 23 U/L (ref 15–41)
Albumin: 4 g/dL (ref 3.5–5.0)
Alkaline Phosphatase: 80 U/L (ref 38–126)
Anion gap: 12 (ref 5–15)
BUN: 19 mg/dL (ref 6–20)
CO2: 19 mmol/L — ABNORMAL LOW (ref 22–32)
Calcium: 9.5 mg/dL (ref 8.9–10.3)
Chloride: 109 mmol/L (ref 98–111)
Creatinine, Ser: 1.11 mg/dL (ref 0.61–1.24)
GFR, Estimated: >60 mL/min (ref >60)
Glucose, Bld: 162 mg/dL — ABNORMAL HIGH (ref 70–99)
Potassium: 4 mmol/L (ref 3.5–5.1)
Sodium: 140 mmol/L (ref 135–145)
Total Bilirubin: 1 mg/dL (ref 0.3–1.2)
Total Protein: 6.9 g/dL (ref 6.5–8.1)

## 2022-01-06 LAB — CBC
HCT: 53 % — ABNORMAL HIGH (ref 39.0–52.0)
Hemoglobin: 17.5 g/dL — ABNORMAL HIGH (ref 13.0–17.0)
MCH: 28.4 pg (ref 26.0–34.0)
MCHC: 33 g/dL (ref 30.0–36.0)
MCV: 85.9 fL (ref 80.0–100.0)
Platelets: 198 10*3/uL (ref 150–400)
RBC: 6.17 MIL/uL — ABNORMAL HIGH (ref 4.22–5.81)
RDW: 14.1 % (ref 11.5–15.5)
WBC: 10.9 10*3/uL — ABNORMAL HIGH (ref 4.0–10.5)
nRBC: 0 % (ref 0.0–0.2)

## 2022-01-06 LAB — RESP PANEL BY RT-PCR (FLU A&B, COVID) ARPGX2
Influenza A by PCR: NEGATIVE
Influenza B by PCR: NEGATIVE
SARS Coronavirus 2 by RT PCR: NEGATIVE

## 2022-01-06 LAB — URINALYSIS, ROUTINE W REFLEX MICROSCOPIC
Bilirubin Urine: NEGATIVE
Glucose, UA: NEGATIVE mg/dL
Hgb urine dipstick: NEGATIVE
Ketones, ur: NEGATIVE mg/dL
Leukocytes,Ua: NEGATIVE
Nitrite: NEGATIVE
Protein, ur: NEGATIVE mg/dL
Specific Gravity, Urine: 1.015 (ref 1.005–1.030)
pH: 5 (ref 5.0–8.0)

## 2022-01-06 LAB — LACTIC ACID, PLASMA
Lactic Acid, Venous: 2.3 mmol/L (ref 0.5–1.9)
Lactic Acid, Venous: 1.7 mmol/L (ref 0.5–1.9)

## 2022-01-06 LAB — BASIC METABOLIC PANEL
Anion gap: 12 (ref 5–15)
BUN: 19 mg/dL (ref 6–20)
CO2: 22 mmol/L (ref 22–32)
Calcium: 9.7 mg/dL (ref 8.9–10.3)
Chloride: 108 mmol/L (ref 98–111)
Creatinine, Ser: 1.13 mg/dL (ref 0.61–1.24)
GFR, Estimated: >60 mL/min (ref >60)
Glucose, Bld: 135 mg/dL — ABNORMAL HIGH (ref 70–99)
Potassium: 4.3 mmol/L (ref 3.5–5.1)
Sodium: 142 mmol/L (ref 135–145)

## 2022-01-06 LAB — CBG MONITORING, ED: Glucose-Capillary: 163 mg/dL — ABNORMAL HIGH (ref 70–99)

## 2022-01-06 LAB — D-DIMER, QUANTITATIVE: D-Dimer, Quant: 1.22 ug/mL-FEU — ABNORMAL HIGH (ref 0.00–0.50)

## 2022-01-06 MED ORDER — IOHEXOL 350 MG/ML SOLN
100.0000 mL | Freq: Once | INTRAVENOUS | Status: AC | PRN
  Administered 2022-01-06: 100 mL via INTRAVENOUS

## 2022-01-06 MED ORDER — VANCOMYCIN HCL 2000 MG/400ML IV SOLN
2000.0000 mg | Freq: Once | INTRAVENOUS | Status: AC
  Administered 2022-01-06: 2000 mg via INTRAVENOUS
  Filled 2022-01-06: qty 400

## 2022-01-06 MED ORDER — LACTATED RINGERS IV SOLN: INTRAVENOUS | Status: DC

## 2022-01-06 MED ORDER — LACTATED RINGERS IV BOLUS (SEPSIS)
1000.0000 mL | Freq: Once | INTRAVENOUS | Status: AC
  Administered 2022-01-06: 1000 mL via INTRAVENOUS

## 2022-01-06 MED ORDER — VANCOMYCIN HCL IN DEXTROSE 1-5 GM/200ML-% IV SOLN
1000.0000 mg | Freq: Once | INTRAVENOUS | Status: DC
  Filled 2022-01-06: qty 200

## 2022-01-06 MED ORDER — SODIUM CHLORIDE 0.9 % IV SOLN
2.0000 g | Freq: Once | INTRAVENOUS | Status: AC
  Administered 2022-01-06: 2 g via INTRAVENOUS
  Filled 2022-01-06: qty 12.5

## 2022-01-06 MED ORDER — ACETAMINOPHEN 500 MG PO TABS
1000.0000 mg | ORAL_TABLET | Freq: Once | ORAL | Status: AC
  Administered 2022-01-06: 1000 mg via ORAL
  Filled 2022-01-06: qty 2

## 2022-01-06 MED ORDER — LACTATED RINGERS IV BOLUS
1000.0000 mL | Freq: Once | INTRAVENOUS | Status: AC
  Administered 2022-01-06: 1000 mL via INTRAVENOUS

## 2022-01-06 MED ORDER — METRONIDAZOLE 500 MG/100ML IV SOLN
500.0000 mg | Freq: Two times a day (BID) | INTRAVENOUS | Status: DC
  Administered 2022-01-06: 500 mg via INTRAVENOUS
  Filled 2022-01-06: qty 100

## 2022-01-07 DIAGNOSIS — R6883 Chills (without fever): Secondary | ICD-10-CM

## 2022-01-07 MED ORDER — DOXYCYCLINE HYCLATE 100 MG PO CAPS: 100.0000 mg | ORAL_CAPSULE | Freq: Two times a day (BID) | ORAL | 0 refills | Status: AC

## 2022-01-07 MED ORDER — MOXIFLOXACIN HCL 400 MG PO TABS
400.0000 mg | ORAL_TABLET | Freq: Every day | ORAL | 0 refills | Status: AC
Start: 2022-01-07 — End: ?

## 2022-01-11 LAB — CULTURE, BLOOD (ROUTINE X 2)
Culture: NO GROWTH
Culture: NO GROWTH
Special Requests: ADEQUATE

## 2023-02-01 ENCOUNTER — Encounter (HOSPITAL_BASED_OUTPATIENT_CLINIC_OR_DEPARTMENT_OTHER): Payer: Self-pay | Admitting: Emergency Medicine

## 2023-02-01 ENCOUNTER — Other Ambulatory Visit: Payer: Self-pay

## 2023-02-01 ENCOUNTER — Emergency Department (HOSPITAL_BASED_OUTPATIENT_CLINIC_OR_DEPARTMENT_OTHER)
Admission: EM | Admit: 2023-02-01 | Discharge: 2023-02-02 | Disposition: A | Discharge status: Home / Self Care | Payer: BC Managed Care – PPO | Attending: Emergency Medicine | Admitting: Emergency Medicine

## 2023-02-01 DIAGNOSIS — F17210 Nicotine dependence, cigarettes, uncomplicated: Secondary | ICD-10-CM | POA: Insufficient documentation

## 2023-02-01 DIAGNOSIS — W57XXXA Bitten or stung by nonvenomous insect and other nonvenomous arthropods, initial encounter: Secondary | ICD-10-CM | POA: Insufficient documentation

## 2023-02-01 DIAGNOSIS — S80862A Insect bite (nonvenomous), left lower leg, initial encounter: Secondary | ICD-10-CM | POA: Diagnosis not present

## 2023-02-01 DIAGNOSIS — Z79899 Other long term (current) drug therapy: Secondary | ICD-10-CM | POA: Diagnosis not present

## 2023-02-01 DIAGNOSIS — I10 Essential (primary) hypertension: Secondary | ICD-10-CM | POA: Insufficient documentation

## 2023-02-01 DIAGNOSIS — S80861A Insect bite (nonvenomous), right lower leg, initial encounter: Secondary | ICD-10-CM | POA: Diagnosis not present

## 2023-02-01 DIAGNOSIS — S80869A Insect bite (nonvenomous), unspecified lower leg, initial encounter: Secondary | ICD-10-CM

## 2023-02-01 NOTE — ED Triage Notes (Signed)
  Patient comes in with multiple bee stings that occurred around 1500 this afternoon.  Patient states he was stung around 6-7 times on his bilateral legs.  Patient took 800 mg ibuprofen around 1800.  Patient states he is having worsening pain in lower legs and they feel swollen.  Denies any SOB, or increased WOB.  Pain 8/10, sharp.

## 2023-02-02 MED ORDER — KETOROLAC TROMETHAMINE 60 MG/2ML IM SOLN
30.0000 mg | Freq: Once | INTRAMUSCULAR | Status: AC
  Administered 2023-02-02: 30 mg via INTRAMUSCULAR

## 2023-02-02 MED ORDER — ACETAMINOPHEN 500 MG PO TABS
ORAL_TABLET | ORAL | Status: AC
  Filled 2023-02-02: qty 2

## 2023-02-02 MED ORDER — KETOROLAC TROMETHAMINE 30 MG/ML IJ SOLN
INTRAMUSCULAR | Status: AC
  Filled 2023-02-02: qty 1

## 2023-02-02 MED ORDER — IBUPROFEN 600 MG PO TABS: 600.0000 mg | ORAL_TABLET | Freq: Four times a day (QID) | ORAL | 0 refills | Status: DC | PRN

## 2023-02-02 MED ORDER — ACETAMINOPHEN 325 MG PO TABS: 650.0000 mg | ORAL_TABLET | Freq: Four times a day (QID) | ORAL | 0 refills | Status: DC | PRN

## 2023-02-02 MED ORDER — OXYCODONE HCL 5 MG PO TABS: 5.0000 mg | ORAL_TABLET | Freq: Four times a day (QID) | ORAL | 0 refills | Status: DC | PRN

## 2023-02-02 MED ORDER — DIPHENHYDRAMINE HCL 25 MG PO TABS: 25.0000 mg | ORAL_TABLET | Freq: Four times a day (QID) | ORAL | 0 refills | Status: DC | PRN

## 2023-02-02 MED ORDER — ACETAMINOPHEN 325 MG PO TABS: 650.0000 mg | ORAL_TABLET | Freq: Four times a day (QID) | ORAL | 0 refills | Status: AC | PRN

## 2023-02-02 MED ORDER — DIPHENHYDRAMINE HCL 25 MG PO TABS: 25.0000 mg | ORAL_TABLET | Freq: Four times a day (QID) | ORAL | 0 refills | Status: AC | PRN

## 2023-02-02 MED ORDER — IBUPROFEN 600 MG PO TABS: 600.0000 mg | ORAL_TABLET | Freq: Four times a day (QID) | ORAL | 0 refills | Status: AC | PRN

## 2023-02-02 MED ORDER — OXYCODONE HCL 5 MG PO TABS: 5.0000 mg | ORAL_TABLET | Freq: Four times a day (QID) | ORAL | 0 refills | Status: AC | PRN

## 2023-02-02 MED ORDER — ACETAMINOPHEN 500 MG PO TABS
1000.0000 mg | ORAL_TABLET | Freq: Once | ORAL | Status: AC
  Administered 2023-02-02: 1000 mg via ORAL

## 2023-02-02 NOTE — Discharge Instructions (Signed)
It was a pleasure caring for you today in the emergency department. ° °Please return to the emergency department for any worsening or worrisome symptoms. ° ° °

## 2023-02-02 NOTE — ED Provider Notes (Signed)
[facility] Provider Note  CSN: [id number] Arrival date & time: 02/01/23 2210  Chief Complaint(s) Insect Bite  HPI Corey Dominguez is a 57 y.o. male with past medical history as below, significant for htn who presents to the ED with complaint of yellow jacket sting.   Pt reports was working outside, was stung by multiple yellow jackets to his B/L LE. No bites to face or upper extremities, he has pain to his legs b/l and some slight swelling. No drainage or warmth. He is ambulatory but having some pain. He took OTC analgesic PTA without much relief of his pain. He denies known allergy to the insect or any evidence of facial / mouth swelling, no n/v, no rashes noted. No other complaints   Past Medical History Past Medical History:  Diagnosis Date   Hypertension    Patient Active Problem List   Diagnosis Date Noted   Shaking chills 01/07/2022   Home Medication(s) Prior to Admission medications   Medication Sig Start Date End Date Taking? Authorizing Provider  acetaminophen (TYLENOL) 325 MG tablet Take 2 tablets (650 mg total) by mouth every 6 (six) hours as needed. 02/02/23   Sloan Leiter, DO  atorvastatin (LIPITOR) 20 MG tablet Take 20 mg by mouth every evening.    [provider]  azithromycin (ZITHROMAX) 250 MG tablet Take 1 tablet (250 mg total) by mouth daily. Take first 2 tablets together, then 1 every day until finished. Patient not taking: Reported on 01/06/2022 08/10/14   Gwyneth Sprout, MD  diphenhydrAMINE (BENADRYL) 25 MG tablet Take 1 tablet (25 mg total) by mouth every 6 (six) hours as needed for itching. 02/02/23   Sloan Leiter, DO  doxycycline (VIBRAMYCIN) 100 MG capsule Take 1 capsule (100 mg total) by mouth 2 (two) times daily. Avoid sun exposure 01/07/22   Cathren Laine, MD  etodolac (LODINE) 400 MG tablet Take 400 mg by mouth 2 (two) times daily. 10/25/21   [provider]  ibuprofen (ADVIL) 600 MG tablet  Take 1 tablet (600 mg total) by mouth every 6 (six) hours as needed. 02/02/23   Sloan Leiter, DO  lisinopril (PRINIVIL,ZESTRIL) 20 MG tablet Take 20 mg by mouth daily.    [provider]  metFORMIN (GLUCOPHAGE) 500 MG tablet Take 1 tablet (500 mg total) by mouth 2 (two) times daily with a meal. Patient not taking: Reported on 01/06/2022 08/10/14   Gwyneth Sprout, MD  moxifloxacin (AVELOX) 400 MG tablet Take 1 tablet (400 mg total) by mouth daily at 8 pm. 01/07/22   Cathren Laine, MD  oxyCODONE (ROXICODONE) 5 MG immediate release tablet Take 1 tablet (5 mg total) by mouth every 6 (six) hours as needed for severe pain. 02/02/23   Tanda Rockers A, DO  RYBELSUS 7 MG TABS Take 1 tablet by mouth daily. 12/26/21   [provider]  silver sulfADIAZINE (SILVADENE) 1 % cream Apply 1 application topically daily. Patient not taking: Reported on 01/06/2022 06/25/15   Tilden Fossa, MD  Past Surgical History Past Surgical History:  Procedure Laterality Date   HERNIA REPAIR     Family History History reviewed. No pertinent family history.  Social History Social History   Tobacco Use   Smoking status: Every Day    Current packs/day: 0.50    Types: Cigarettes   Smokeless tobacco: Never  Substance Use Topics   Alcohol use: No   Drug use: No   Allergies Patient has no known allergies.  Review of Systems Review of Systems  Constitutional:  Negative for chills and fever.  HENT:  Negative for trouble swallowing.   Eyes:  Negative for visual disturbance.  Respiratory:  Negative for chest tightness and shortness of breath.   Cardiovascular:  Negative for chest pain and palpitations.  Gastrointestinal:  Negative for abdominal pain, nausea and vomiting.  Genitourinary:  Negative for difficulty urinating.  Musculoskeletal:  Negative for gait problem.  Skin:   Positive for wound.  All other systems reviewed and are negative.   Physical Exam Vital Signs  I have reviewed the triage vital signs BP 139/84 (BP Location: Right Arm)   Pulse 98   Temp 98.1 F (36.7 C) (Oral)   Resp 18   Ht 6\' 1"  (1.854 m)   Wt 117.9 kg   SpO2 99%   BMI 34.30 kg/m  Physical Exam Vitals and nursing note reviewed.  Constitutional:      General: He is not in acute distress.    Appearance: Normal appearance. He is well-developed. He is not ill-appearing.  HENT:     Head: Normocephalic and atraumatic. No right periorbital erythema or left periorbital erythema.     Jaw: No trismus.     Comments: No facial swelling    Right Ear: External ear normal.     Left Ear: External ear normal.     Nose: Nose normal.     Mouth/Throat:     Mouth: Mucous membranes are moist.  Eyes:     General: No scleral icterus.       Right eye: No discharge.        Left eye: No discharge.  Cardiovascular:     Rate and Rhythm: Normal rate.  Pulmonary:     Effort: Pulmonary effort is normal. No respiratory distress.     Breath sounds: No stridor.  Abdominal:     General: Abdomen is flat. There is no distension.     Tenderness: There is no guarding.  Musculoskeletal:        General: No deformity.     Cervical back: No rigidity.  Skin:    General: Skin is warm and dry.     Coloration: Skin is not cyanotic, jaundiced or pale.     Comments: Multiple bug bites noted to b/l LE.  No cellulitis Compartments are soft LE NVI   Neurological:     Mental Status: He is alert and oriented to person, place, and time.     GCS: GCS eye subscore is 4. GCS verbal subscore is 5. GCS motor subscore is 6.  Psychiatric:        Speech: Speech normal.        Behavior: Behavior normal. Behavior is cooperative.     ED Results and Treatments Labs (all labs ordered are listed, but only abnormal results are displayed) Labs Reviewed - No data to display  Radiology No results found.  Pertinent labs & imaging results that were available during my care of the patient were reviewed by me and considered in my medical decision making (see MDM for details).  Medications Ordered in ED Medications  ketorolac (TORADOL) injection 30 mg (30 mg Intramuscular Given 02/02/23 0232)  acetaminophen (TYLENOL) tablet 1,000 mg (1,000 mg Oral Given 02/02/23 0232)                                                                                                                                     Procedures Procedures  (including critical care time)  Medical Decision Making / ED Course    Medical Decision Making:    Corey Dominguez is a 57 y.o. male with past medical history as below, significant for htn who presents to the ED with complaint of yellow jacket sting. . The complaint involves an extensive differential diagnosis and also carries with it a high risk of complications and morbidity.  Serious etiology was considered.   Complete initial physical exam performed, notably the patient  was NAD, no angio edema, no rash.    Reviewed and confirmed nursing documentation for past medical history, family history, social history.  Vital signs reviewed.         Here with insect sting / yellow jacket sting to b/l legs Pain to bite/sting locations  No cellulitis, no drainage, not septic Gait steady Exam stable No evidence of anaphylaxis or allergic reaction on exam Give toradol, prior renal fxn stable Analgesia for home, benadryl prn F/u pcp  The patient improved significantly and was discharged in stable condition. Detailed discussions were had with the patient regarding current findings, and need for close f/u with PCP or on call doctor. The patient has been instructed to return immediately if the symptoms worsen in any way for re-evaluation. Patient verbalized understanding and is in  agreement with current care plan. All questions answered prior to discharge.      Additional history obtained: -Additional history obtained from na -External records from outside source obtained and reviewed including: Chart review including previous notes, labs, imaging, consultation notes including prior Cr stable   Lab Tests: na  EKG   EKG Interpretation Date/Time:    Ventricular Rate:    PR Interval:    QRS Duration:    QT Interval:    QTC Calculation:   R Axis:      Text Interpretation:           Imaging Studies ordered: na   Medicines ordered and prescription drug management: Meds ordered this encounter  Medications   ketorolac (TORADOL) injection 30 mg   acetaminophen (TYLENOL) tablet 1,000 mg   DISCONTD: oxyCODONE (ROXICODONE) 5 MG immediate release tablet    Sig: Take 1 tablet (5 mg total) by mouth every 6 (six) hours as needed for severe pain.    Dispense:  5 tablet    Refill:  0   DISCONTD: acetaminophen (TYLENOL) 325 MG tablet    Sig: Take 2 tablets (650 mg total) by mouth every 6 (six) hours as needed.    Dispense:  36 tablet    Refill:  0   DISCONTD: ibuprofen (ADVIL) 600 MG tablet    Sig: Take 1 tablet (600 mg total) by mouth every 6 (six) hours as needed.    Dispense:  30 tablet    Refill:  0   DISCONTD: diphenhydrAMINE (BENADRYL) 25 MG tablet    Sig: Take 1 tablet (25 mg total) by mouth every 6 (six) hours as needed for itching.    Dispense:  5 tablet    Refill:  0   acetaminophen (TYLENOL) 325 MG tablet    Sig: Take 2 tablets (650 mg total) by mouth every 6 (six) hours as needed.    Dispense:  36 tablet    Refill:  0   diphenhydrAMINE (BENADRYL) 25 MG tablet    Sig: Take 1 tablet (25 mg total) by mouth every 6 (six) hours as needed for itching.    Dispense:  5 tablet    Refill:  0   ibuprofen (ADVIL) 600 MG tablet    Sig: Take 1 tablet (600 mg total) by mouth every 6 (six) hours as needed.    Dispense:  30 tablet    Refill:  0    oxyCODONE (ROXICODONE) 5 MG immediate release tablet    Sig: Take 1 tablet (5 mg total) by mouth every 6 (six) hours as needed for severe pain.    Dispense:  5 tablet    Refill:  0    -I have reviewed the patients home medicines and have made adjustments as needed   Consultations Obtained: na   Cardiac Monitoring: Continous pulse oximetry 99-100% on room air interpreted by myself  Social Determinants of Health:  Diagnosis or treatment significantly limited by social determinants of health: obesity   Reevaluation: After the interventions noted above, I reevaluated the patient and found that they have improved  Co morbidities that complicate the patient evaluation  Past Medical History:  Diagnosis Date   Hypertension       Dispostion: Disposition decision including need for hospitalization was considered, and patient discharged from emergency department.    Final Clinical Impression(s) / ED Diagnoses Final diagnoses:  Insect bite of lower leg, unspecified laterality, initial encounter     This chart was dictated using voice recognition software.  Despite best efforts to proofread,  errors can occur which can change the documentation meaning.    Sloan Leiter, DO 02/02/23 [phone]

## 2023-02-02 NOTE — ED Notes (Signed)
..  The patient is A&OX4, ambulatory at d/c with independent steady gait, NAD. Pt verbalized understanding of d/c instructions, prescriptions and follow up care.  

## 2023-12-16 ENCOUNTER — Other Ambulatory Visit: Payer: Self-pay

## 2023-12-16 ENCOUNTER — Emergency Department (HOSPITAL_BASED_OUTPATIENT_CLINIC_OR_DEPARTMENT_OTHER)

## 2023-12-16 ENCOUNTER — Encounter (HOSPITAL_BASED_OUTPATIENT_CLINIC_OR_DEPARTMENT_OTHER): Payer: Self-pay

## 2023-12-16 ENCOUNTER — Emergency Department (HOSPITAL_BASED_OUTPATIENT_CLINIC_OR_DEPARTMENT_OTHER)
Admission: EM | Admit: 2023-12-16 | Discharge: 2023-12-16 | Disposition: A | Discharge status: Home / Self Care | Attending: Emergency Medicine | Admitting: Emergency Medicine

## 2023-12-16 DIAGNOSIS — I69398 Other sequelae of cerebral infarction: Secondary | ICD-10-CM

## 2023-12-16 DIAGNOSIS — R479 Unspecified speech disturbances: Secondary | ICD-10-CM | POA: Insufficient documentation

## 2023-12-16 DIAGNOSIS — R519 Headache, unspecified: Secondary | ICD-10-CM | POA: Diagnosis not present

## 2023-12-16 DIAGNOSIS — I69328 Other speech and language deficits following cerebral infarction: Secondary | ICD-10-CM | POA: Diagnosis not present

## 2023-12-16 DIAGNOSIS — R42 Dizziness and giddiness: Secondary | ICD-10-CM | POA: Insufficient documentation

## 2023-12-16 DIAGNOSIS — R29818 Other symptoms and signs involving the nervous system: Secondary | ICD-10-CM

## 2023-12-16 LAB — DIFFERENTIAL
Abs Immature Granulocytes: 0.06 10*3/uL (ref 0.00–0.07)
Basophils Absolute: 0 10*3/uL (ref 0.0–0.1)
Basophils Relative: 0 %
Eosinophils Absolute: 0.1 10*3/uL (ref 0.0–0.5)
Eosinophils Relative: 1 %
Immature Granulocytes: 1 %
Lymphocytes Relative: 28 %
Lymphs Abs: 3.3 10*3/uL (ref 0.7–4.0)
Monocytes Absolute: 1 10*3/uL (ref 0.1–1.0)
Monocytes Relative: 9 %
Neutro Abs: 7.2 10*3/uL (ref 1.7–7.7)
Neutrophils Relative %: 61 %

## 2023-12-16 LAB — CBC
HCT: 52.5 % — ABNORMAL HIGH (ref 39.0–52.0)
Hemoglobin: 17.6 g/dL — ABNORMAL HIGH (ref 13.0–17.0)
MCH: 28.6 pg (ref 26.0–34.0)
MCHC: 33.5 g/dL (ref 30.0–36.0)
MCV: 85.4 fL (ref 80.0–100.0)
Platelets: 219 10*3/uL (ref 150–400)
RBC: 6.15 MIL/uL — ABNORMAL HIGH (ref 4.22–5.81)
RDW: 14.1 % (ref 11.5–15.5)
WBC: 11.8 10*3/uL — ABNORMAL HIGH (ref 4.0–10.5)
nRBC: 0 % (ref 0.0–0.2)

## 2023-12-16 LAB — COMPREHENSIVE METABOLIC PANEL WITH GFR
ALT: 18 U/L (ref 0–44)
AST: 17 U/L (ref 15–41)
Albumin: 4.1 g/dL (ref 3.5–5.0)
Alkaline Phosphatase: 101 U/L (ref 38–126)
Anion gap: 12 (ref 5–15)
BUN: 10 mg/dL (ref 6–20)
CO2: 21 mmol/L — ABNORMAL LOW (ref 22–32)
Calcium: 9.4 mg/dL (ref 8.9–10.3)
Chloride: 106 mmol/L (ref 98–111)
Creatinine, Ser: 1.13 mg/dL (ref 0.61–1.24)
GFR, Estimated: >60 mL/min (ref >60)
Glucose, Bld: 140 mg/dL — ABNORMAL HIGH (ref 70–99)
Potassium: 3.9 mmol/L (ref 3.5–5.1)
Sodium: 139 mmol/L (ref 135–145)
Total Bilirubin: 0.7 mg/dL (ref 0.0–1.2)
Total Protein: 6.4 g/dL — ABNORMAL LOW (ref 6.5–8.1)

## 2023-12-16 LAB — URINALYSIS, ROUTINE W REFLEX MICROSCOPIC
Glucose, UA: >=500 mg/dL — AB
Hgb urine dipstick: NEGATIVE
Ketones, ur: NEGATIVE mg/dL
Leukocytes,Ua: NEGATIVE
Nitrite: NEGATIVE
Protein, ur: NEGATIVE mg/dL
Specific Gravity, Urine: 1.01 (ref 1.005–1.030)
pH: 7 (ref 5.0–8.0)

## 2023-12-16 LAB — PROTIME-INR
INR: 1 (ref 0.8–1.2)
Prothrombin Time: 13.5 s (ref 11.4–15.2)

## 2023-12-16 LAB — URINALYSIS, MICROSCOPIC (REFLEX)

## 2023-12-16 LAB — URINE DRUG SCREEN
Amphetamines: NOT DETECTED
Barbiturates: NOT DETECTED
Benzodiazepines: NOT DETECTED
Cocaine: NOT DETECTED
Fentanyl: NOT DETECTED
Methadone Scn, Ur: NOT DETECTED
Opiates: NOT DETECTED
Tetrahydrocannabinol: NOT DETECTED

## 2023-12-16 LAB — APTT: aPTT: 27 s (ref 24–36)

## 2023-12-16 LAB — CBG MONITORING, ED: Glucose-Capillary: 156 mg/dL — ABNORMAL HIGH (ref 70–99)

## 2023-12-16 LAB — ETHANOL: Alcohol, Ethyl (B): <15 mg/dL (ref <15)

## 2023-12-16 MED ORDER — DIPHENHYDRAMINE HCL 50 MG/ML IJ SOLN
25.0000 mg | Freq: Once | INTRAMUSCULAR | Status: AC
  Administered 2023-12-16: 25 mg via INTRAVENOUS
  Filled 2023-12-16: qty 1

## 2023-12-16 MED ORDER — PROCHLORPERAZINE EDISYLATE 10 MG/2ML IJ SOLN
10.0000 mg | Freq: Once | INTRAMUSCULAR | Status: AC
  Administered 2023-12-16: 10 mg via INTRAVENOUS
  Filled 2023-12-16: qty 2

## 2023-12-16 MED ORDER — METOCLOPRAMIDE HCL 10 MG PO TABS: 10.0000 mg | ORAL_TABLET | Freq: Four times a day (QID) | ORAL | 0 refills | Status: AC | PRN

## 2023-12-16 MED ORDER — IOHEXOL 350 MG/ML SOLN
75.0000 mL | Freq: Once | INTRAVENOUS | Status: AC | PRN
  Administered 2023-12-16: 75 mL via INTRAVENOUS

## 2023-12-16 NOTE — Consult Note (Signed)
 [facility] Telemedicine Consult   Requesting Provider: Dr. Inga Manges Consult Participants: Dr. Anastasia Balo, telestroke RN, bedside RN Location of the provider: [facility] Location of the patient: [id number]  This consult was provided via telemedicine with 2-way video and audio communication. The patient/family was informed that care would be provided in this way and agreed to receive care in this manner.   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  The headache dizziness and speech started are all worse today than it they had been since the stroke. Code stroke was activated due to focal symptoms.  Last known well reportedly 11:30 AM   Past Medical History:  Diagnosis Date   Hypertension     No current facility-administered medications for this encounter.  Current Outpatient Medications:    acetaminophen  (TYLENOL ) 325 MG tablet, Take 2 tablets (650 mg total) by mouth every 6 (six) hours as needed., Disp: 36 tablet, Rfl: 0   atorvastatin (LIPITOR) 20 MG tablet, Take 20 mg by mouth every evening., Disp: , Rfl:    azithromycin  (ZITHROMAX ) 250 MG tablet, Take 1 tablet (250 mg total) by mouth daily. Take first 2 tablets together, then 1 every day until finished. (Patient not taking: Reported on 01/06/2022), Disp: 6 tablet, Rfl: 0   diphenhydrAMINE  (BENADRYL ) 25 MG tablet, Take 1 tablet (25 mg total) by mouth every 6 (six) hours as needed for itching., Disp: 5 tablet, Rfl: 0   doxycycline  (VIBRAMYCIN ) 100 MG capsule, Take 1 capsule (100 mg total) by mouth 2  (two) times daily. Avoid sun exposure, Disp: 14 capsule, Rfl: 0   etodolac (LODINE) 400 MG tablet, Take 400 mg by mouth 2 (two) times daily., Disp: , Rfl:    ibuprofen  (ADVIL ) 600 MG tablet, Take 1 tablet (600 mg total) by mouth every 6 (six) hours as needed., Disp: 30 tablet, Rfl: 0   lisinopril (PRINIVIL,ZESTRIL) 20 MG tablet, Take 20 mg by mouth daily., Disp: , Rfl:    metFORMIN  (GLUCOPHAGE ) 500 MG tablet, Take 1 tablet (500 mg total) by mouth 2 (two) times daily with a meal. (Patient not taking: Reported on 01/06/2022), Disp: 60 tablet, Rfl: 0   moxifloxacin  (AVELOX ) 400 MG tablet, Take 1 tablet (400 mg total) by mouth daily at 8 pm., Disp: 7 tablet, Rfl: 0   oxyCODONE  (ROXICODONE ) 5 MG immediate release tablet, Take 1 tablet (5 mg total) by mouth every 6 (six) hours as needed for severe pain., Disp: 5 tablet, Rfl: 0   RYBELSUS 7 MG TABS, Take 1 tablet by mouth daily., Disp: , Rfl:    silver  sulfADIAZINE  (SILVADENE ) 1 % cream, Apply 1 application topically daily. (Patient not taking: Reported on 01/06/2022), Disp: 50 g, Rfl: 0    LKW: 11:30 AM IV thrombolysis given?: No, recent stroke IR Thrombectomy?  No: Clinical exam not consistent with LVO, also CT angiography head and neck shows no LVO Modified Rankin Scale: 1-No significant post stroke disability and can perform usual duties with stroke symptoms Time of teleneurologist evaluation: 1307 hrs.  Exam: Vitals:   12/16/23 1258  BP: 131/81  Pulse: 99  Resp: 20  Temp: 98.3 F (36.8 C)  SpO2: 97%    General: Awake alert in no distress Neurological exam Awake alert oriented x 3 Speech does not have a frank dysarthria but rather a stutter to it which he says has been persistent since 11/22/2023 when he had the stroke. No aphasia Cranial nerves II to XII intact Motor examination with no drift Sensory examination with diminished sensation on the left forearm in comparison to the right but otherwise all intact Good examination with no  gross dysmetria   NIHSS 1A: Level of Consciousness - 0 1B: Ask Month and Age - 0 1C: 'Blink Eyes' & 'Squeeze Hands' - 0 2: Test Horizontal Extraocular Movements - 0 3: Test Visual Fields - 0 4: Test Facial Palsy - 0 5A: Test Left Arm Motor Drift - 0 5B: Test Right Arm Motor Drift - 0 6A: Test Left Leg Motor Drift - 0 6B: Test Right Leg Motor Drift - 0 7: Test Limb Ataxia - 0 8: Test Sensation - 1 9: Test Language/Aphasia- 0 10: Test Dysarthria - 1 11: Test Extinction/Inattention - 0 NIHSS score: 2   Imaging Reviewed: CT head with no acute changes.  CT angiography head and neck-preliminary review with no ELVO.  Labs reviewed in epic and pertinent values follow: CBC    Component Value Date/Time   WBC 11.8 (H) 12/16/2023 1300   RBC 6.15 (H) 12/16/2023 1300   HGB 17.6 (H) 12/16/2023 1300   HCT 52.5 (H) 12/16/2023 1300   PLT 219 12/16/2023 1300   MCV 85.4 12/16/2023 1300   MCH 28.6 12/16/2023 1300   MCHC 33.5 12/16/2023 1300   RDW 14.1 12/16/2023 1300   LYMPHSABS 3.3 12/16/2023 1300   MONOABS 1.0 12/16/2023 1300   EOSABS 0.1 12/16/2023 1300   BASOSABS 0.0 12/16/2023 1300   CMP     Component Value Date/Time   NA 140 01/06/2022 1935   K 4.0 01/06/2022 1935   CL 109 01/06/2022 1935   CO2 19 (L) 01/06/2022 1935   GLUCOSE 162 (H) 01/06/2022 1935   BUN 19 01/06/2022 1935   CREATININE 1.11 01/06/2022 1935   CALCIUM 9.5 01/06/2022 1935   PROT 6.9 01/06/2022 1935   ALBUMIN 4.0 01/06/2022 1935   AST 23 01/06/2022 1935   ALT 30 01/06/2022 1935   ALKPHOS 80 01/06/2022 1935   BILITOT 1.0 01/06/2022 1935   GFRNONAA >60 01/06/2022 1935   GFRAA >90 08/10/2014 1130     Assessment: 58 year old with a recent right parietal stroke with residual stuttering, headache since then, got dizzy in church-described difficulty standing not really lightheaded or vertiginous, had worsened stuttering speech and headache that made him come to the ER.  In the ER, he does have a stuttering  quality to his speech and also has minimal sensory difference on the left forearm in comparison to the right. Not a candidate for TNK due to recent stroke. Not a candidate for EVT due to exam not consistent with LVO and CT angiography head and neck revealing no ELVO. I would recommend further workup with an MRI, which is present at the site today, to rule out expansion of the old stroke.  If that is negative, needs infectious workup to look for any causes of infection that might be causing recrudescence of hers stroke symptoms  Recommendations:  Stat MRI -if negative, look towards any electrolyte or toxic metabolic derangements which might of caused recrudescence of old symptoms. - If positive, may need further workup inpatient. Can  try migraine cocktail for the headache-there is no evidence of bleed on the CT head. Will update recommendations after the formal read on the CTA and the MRI are completed  Plan discussed with the patient as well as Dr. Inga Manges.   Addendum CT angio head and neck with no emergent large vessel occlusion.  Mild calcified plaque along the left carotid bulb and mixed plaque along the proximal right cervical ICA without hemodynamically significant stenosis. MRI brain:-No acute process. No further inpatient workup needed  Plan discussed with Dr. Delana Favors -- Tona Francis, MD Neurologist [facility] Pager: [phone]

## 2023-12-16 NOTE — Discharge Instructions (Addendum)
 As we discussed, you do not have a new stroke today.  I recommend you to continue taking of medicines as prescribed by your doctor  Take reglan as needed for headache   Please follow-up with your neurologist  Return to ER if you have worsening dizziness or headache or trouble speaking or weakness or numbness

## 2023-12-16 NOTE — ED Triage Notes (Signed)
 L sided weakness, headache, "stuttered speech" since 1130 today. States he has felt dizzy for 3 weeks.   EDP at bedside

## 2023-12-16 NOTE — ED Notes (Signed)
 Patient transported to CT with RN

## 2023-12-16 NOTE — ED Provider Notes (Signed)
 [facility] Provider Note   CSN: [id number] Arrival date & time: 12/16/23  1246     History  Chief Complaint  Patient presents with   Weakness    Corey Dominguez is a 58 y.o. male.  58 yo M with a chief complaints of sudden onset dizziness and stuttering of his speech and perhaps left-sided weakness.  The patient just had a stroke April 8.  He was admitted to an outside hospital for that.  Has been on medications and he denies any recent change or missed dosages.  He was at church today and suddenly feeling the dizziness was worse.  This was maybe an hour and a half ago.  Had had a change to his speech.  Also felt maybe his left side was a little bit weaker than it had been.  EMS was called and he was evaluated and then they decided to get in their car and drive to this stand-alone facility.   Weakness      Home Medications Prior to Admission medications   Medication Sig Start Date End Date Taking? Authorizing Provider  acetaminophen  (TYLENOL ) 325 MG tablet Take 2 tablets (650 mg total) by mouth every 6 (six) hours as needed. 02/02/23   Teddi Favors, DO  atorvastatin (LIPITOR) 20 MG tablet Take 20 mg by mouth every evening.    [provider]  azithromycin  (ZITHROMAX ) 250 MG tablet Take 1 tablet (250 mg total) by mouth daily. Take first 2 tablets together, then 1 every day until finished. Patient not taking: Reported on 01/06/2022 08/10/14   Almond Army, MD  diphenhydrAMINE  (BENADRYL ) 25 MG tablet Take 1 tablet (25 mg total) by mouth every 6 (six) hours as needed for itching. 02/02/23   Teddi Favors, DO  doxycycline  (VIBRAMYCIN ) 100 MG capsule Take 1 capsule (100 mg total) by mouth 2 (two) times daily. Avoid sun exposure 01/07/22   Steinl, Kevin, MD  etodolac (LODINE) 400 MG tablet Take 400 mg by mouth 2 (two) times daily. 10/25/21   [provider]  ibuprofen  (ADVIL ) 600 MG tablet Take 1 tablet (600 mg total) by  mouth every 6 (six) hours as needed. 02/02/23   Teddi Favors, DO  lisinopril (PRINIVIL,ZESTRIL) 20 MG tablet Take 20 mg by mouth daily.    [provider]  metFORMIN  (GLUCOPHAGE ) 500 MG tablet Take 1 tablet (500 mg total) by mouth 2 (two) times daily with a meal. Patient not taking: Reported on 01/06/2022 08/10/14   Almond Army, MD  moxifloxacin  (AVELOX ) 400 MG tablet Take 1 tablet (400 mg total) by mouth daily at 8 pm. 01/07/22   Guadalupe Lee, MD  oxyCODONE  (ROXICODONE ) 5 MG immediate release tablet Take 1 tablet (5 mg total) by mouth every 6 (six) hours as needed for severe pain. 02/02/23   Russella Courts A, DO  RYBELSUS 7 MG TABS Take 1 tablet by mouth daily. 12/26/21   [provider]  silver  sulfADIAZINE  (SILVADENE ) 1 % cream Apply 1 application topically daily. Patient not taking: Reported on 01/06/2022 06/25/15   Kelsey Patricia, MD      Allergies    Patient has no known allergies.    Review of Systems   Review of Systems  Neurological:  Positive for weakness.    Physical Exam Updated Vital Signs BP 107/76   Pulse (!) 103   Temp 98.3 F (36.8 C) (Oral)   Resp 20   Wt 122.5 kg   SpO2 96%  BMI 35.62 kg/m  Physical Exam Vitals and nursing note reviewed.  Constitutional:      Appearance: He is well-developed.  HENT:     Head: Normocephalic and atraumatic.  Eyes:     Pupils: Pupils are equal, round, and reactive to light.  Neck:     Vascular: No JVD.  Cardiovascular:     Rate and Rhythm: Normal rate and regular rhythm.     Heart sounds: No murmur heard.    No friction rub. No gallop.  Pulmonary:     Effort: No respiratory distress.     Breath sounds: No wheezing.  Abdominal:     General: There is no distension.     Tenderness: There is no abdominal tenderness. There is no guarding or rebound.  Musculoskeletal:        General: Normal range of motion.     Cervical back: Normal range of motion and neck supple.  Skin:    Coloration: Skin is not  pale.     Findings: No rash.  Neurological:     Mental Status: He is alert and oriented to person, place, and time.     Cranial Nerves: Cranial nerves 2-12 are intact.     Sensory: Sensation is intact.     Motor: Motor function is intact.     Coordination: Coordination is intact.     Comments: I do not appreciate any obvious nystagmus.  He does have some dizziness with extraocular motion.  He perhaps is very mildly weak with the left lower extremity compared to right.  Some stuttering of his speech but no obvious aphasia.  Otherwise I do not appreciate any obvious neurologic deficit.  Psychiatric:        Behavior: Behavior normal.     ED Results / Procedures / Treatments   Labs (all labs ordered are listed, but only abnormal results are displayed) Labs Reviewed  CBC - Abnormal; Notable for the following components:      Result Value   WBC 11.8 (*)    RBC 6.15 (*)    Hemoglobin 17.6 (*)    HCT 52.5 (*)    All other components within normal limits  COMPREHENSIVE METABOLIC PANEL WITH GFR - Abnormal; Notable for the following components:   CO2 21 (*)    Glucose, Bld 140 (*)    Total Protein 6.4 (*)    All other components within normal limits  CBG MONITORING, ED - Abnormal; Notable for the following components:   Glucose-Capillary 156 (*)    All other components within normal limits  ETHANOL  PROTIME-INR  APTT  DIFFERENTIAL  URINE DRUG SCREEN    EKG EKG Interpretation Date/Time:  Sunday December 16 2023 12:56:11 EDT Ventricular Rate:  102 PR Interval:  148 QRS Duration:  90 QT Interval:  332 QTC Calculation: 433 R Axis:   85  Text Interpretation: Sinus tachycardia Since last tracing rate slower Otherwise no significant change Confirmed by Albertus Hughs [phone]) on 12/16/2023 1:07:43 PM  Radiology CT ANGIO HEAD NECK W WO CM (CODE STROKE) Result Date: 12/16/2023 EXAM: CTA Head and Neck with Intravenous Contrast. CLINICAL HISTORY: Neuro deficit, acute, stroke suspected. L sided  weakness, headache, "stuttered speech" since 1130 today. States he has felt dizzy for 3 weeks. Hx stroke. TECHNIQUE: Axial CTA images of the head and neck performed with intravenous contrast. Two-dimensional MIP and/or three-dimensional MIP and volume rendered reformations were performed. Note: Per PQRS, the description of internal carotid artery percent stenosis, including 0 percent or  normal exam, is based on [place] American Symptomatic Carotid Endarterectomy Trial (NASCET) criteria. Dose reduction technique was used including one or more of the following: automated exposure control, adjustment of mA and kV according to patient size, and/or iterative reconstruction. CONTRAST: 75mL iohexol  (OMNIPAQUE ) 350 MG/ML injection. COMPARISON: Head CT dated 12/16/2023. FINDINGS: CTA NECK: COMMON CAROTID ARTERIES: No significant stenosis. No dissection or occlusion. INTERNAL CAROTID ARTERIES: Mild calcified plaque along the left carotid bulb without hemodynamically significant stenosis. Mild mixed plaque along the proximal right cervical ICA without hemodynamically significant stenosis. Atherosclerotic calcifications of the carotid siphons without significant stenosis or aneurysm. No dissection or occlusion. VERTEBRAL ARTERIES: No significant stenosis. No dissection or occlusion. CTA HEAD: ANTERIOR CEREBRAL ARTERIES: No significant stenosis. No occlusion. No aneurysm. MIDDLE CEREBRAL ARTERIES: No significant stenosis. No occlusion. No aneurysm. 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 No significant stenosis. No occlusion. No aneurysm. BASILAR ARTERY: No significant stenosis. No occlusion. No aneurysm. SOFT TISSUES: No acute abnormality. No masses or lymphadenopathy. 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 No acute abnormality. LUNGS: There is subpleural emphysema and pleural parenchymal scarring  in the lung apices. IMPRESSION: 1. No large vessel occlusion. 2. Mild calcified plaque along the left carotid bulb and mild mixed plaque along the proximal right cervical ICA, without hemodynamically significant stenosis. Electronically signed by: Audra Blend MD 12/16/2023 02:33 PM EDT RP Workstation: [id number]   CT HEAD CODE STROKE WO CONTRAST Result Date: 12/16/2023 EXAM: CT HEAD WITHOUT 12/16/2023 01:08:20 PM TECHNIQUE: CT of the head was performed without the administration of intravenous contrast. Automated exposure control, iterative reconstruction, and/or weight based adjustment of the mA/kV was utilized to reduce the radiation dose to as low as reasonably achievable. COMPARISON: None available. CLINICAL HISTORY: Neuro deficit, acute, stroke suspected. FINDINGS: BRAIN AND VENTRICLES: No acute intracranial hemorrhage. No mass effect or midline shift. No extra-axial fluid collection. Gray-white differentiation is maintained. No hydrocephalus. [place] stroke program early CT (ASPECT) score: Ganglionic (caudate, internal capsule, lentiform nucleus, insula, M1-M3): 7 Supraganglionic (M4-M6): 3 Total: 10 ORBITS: No acute abnormality. SINUSES AND MASTOIDS: No acute abnormality. SOFT TISSUES AND SKULL: No acute skull fracture. No acute soft tissue abnormality. IMPRESSION: 1. No acute intracranial abnormality. 2. ASPECT score: 10. Code stroke results were communicated to Dr. Inga Manges at 1:19 PM on 12/16/2023 by phone. Electronically signed by: Audra Blend MD 12/16/2023 01:20 PM EDT RP Workstation: [id number]    Procedures Procedures    Medications Ordered in ED Medications  prochlorperazine (COMPAZINE) injection 10 mg (10 mg Intravenous Given 12/16/23 1311)  diphenhydrAMINE  (BENADRYL ) injection 25 mg (25 mg Intravenous Given 12/16/23 1312)  iohexol  (OMNIPAQUE ) 350 MG/ML injection 75 mL (75 mLs Intravenous Contrast Given 12/16/23 1416)    ED Course/ Medical Decision Making/ A&P                                  Medical Decision Making Amount and/or Complexity of Data Reviewed Labs: ordered. Radiology: ordered.  Risk Prescription drug management.   58 yo M with a chief complaints of dizziness and difficulty with speech and perhaps left-sided weakness.  This started about an hour and a half ago.  The patient unfortunately has been slightly dizzy since he had a stroke but fell like it got worse an hour and a half ago and had had new speech symptoms as well as perhaps  left-sided weakness.  Code stroke was initiated.  No hypoglycemia, mild leukocytosis.  No LFT elevation.  Renal function at baseline.  CT of the head without obvious intracranial hemorrhage.  I discussed this with the radiologist.  Patient was seen remotely by Dr. Arora and thought not to benefit from thrombolytics.  No LVO.  Plan to await MRI.  Signed out to Dr. Delana Favors  please see their note for further details of care in the ED.  The patients results and plan were reviewed and discussed.   Any x-rays performed were independently reviewed by myself.   Differential diagnosis were considered with the presenting HPI.  Medications  prochlorperazine (COMPAZINE) injection 10 mg (10 mg Intravenous Given 12/16/23 1311)  diphenhydrAMINE  (BENADRYL ) injection 25 mg (25 mg Intravenous Given 12/16/23 1312)  iohexol  (OMNIPAQUE ) 350 MG/ML injection 75 mL (75 mLs Intravenous Contrast Given 12/16/23 1416)    Vitals:   12/16/23 1255 12/16/23 1258 12/16/23 1330  BP:  131/81 107/76  Pulse:  99 (!) 103  Resp:  20 20  Temp:  98.3 F (36.8 C)   TempSrc:  Oral   SpO2:  97% 96%  Weight: 122.5 kg      Final diagnoses:  Dizziness  Speech disturbance, unspecified type           Final Clinical Impression(s) / ED Diagnoses Final diagnoses:  Dizziness  Speech disturbance, unspecified type    Rx / DC Orders ED Discharge Orders     None         Albertus Hughs, DO 12/16/23 1507

## 2023-12-16 NOTE — Consult Note (Signed)
 1259 code stroke cart activated./Pt taken to CT 1303 Dr Bonnita Buttner paged [phone] Dr Bonnita Buttner on screen 1308 Pt back from CT MRS 1/ NIH 2.  Pt having dizziness since CVA on 11/22/23.  Pt states dizziness increased with HA and stuttering speech.  NCCT negative.  Pt sent back for CTA 1436 CTA results reported to Dr Bonnita Buttner.

## 2023-12-16 NOTE — ED Notes (Signed)
 Patient transported to MRI

## 2023-12-16 NOTE — ED Notes (Signed)
 ED Provider at bedside.

## 2023-12-16 NOTE — ED Provider Notes (Signed)
  Physical Exam  BP 107/76   Pulse (!) 103   Temp 98.3 F (36.8 C) (Oral)   Resp 20   Wt 122.5 kg   SpO2 96%   BMI 35.62 kg/m   Physical Exam  Procedures  Procedures  ED Course / MDM    Medical Decision Making Care assumed at 3 PM.  Patient has a history of recent stroke with slurred speech and had noticed worsening slurred speech.  Code stroke activated and patient was seen by  Dr. Bonnita Buttner via tele neurology. Signout pending MRI result and consult with neurology  4:30 PM MRI brain did not show any new infarct.  Urinalysis unremarkable.  Patient's symptoms have improved.  Patient already had a Holter monitor and also neurology follow-up.  At this point patient is stable for discharge  Problems Addressed: Dizziness: acute illness or injury Speech disturbance, unspecified type: chronic illness or injury  Amount and/or Complexity of Data Reviewed Labs: ordered. Radiology: ordered.  Risk Prescription drug management.          Dalene Duck, MD 12/16/23 [phone]
